# Patient Record
Sex: Female | Born: 1975 | Race: White | Hispanic: No | Marital: Single | Smoking: Light tobacco smoker
Health system: Southern US, Community
[De-identification: ages and names within clinical notes are randomized; demographics above are authoritative.]

---

## 2003-11-07 ENCOUNTER — Other Ambulatory Visit: Admission: RE | Admit: 2003-11-07 | Discharge: 2003-11-07 | Payer: Self-pay | Admitting: Obstetrics & Gynecology

## 2003-12-05 ENCOUNTER — Ambulatory Visit (HOSPITAL_COMMUNITY): Admission: RE | Admit: 2003-12-05 | Discharge: 2003-12-05 | Payer: Self-pay | Admitting: Obstetrics and Gynecology

## 2004-02-16 ENCOUNTER — Inpatient Hospital Stay (HOSPITAL_COMMUNITY): Admission: AD | Admit: 2004-02-16 | Discharge: 2004-02-16 | Payer: Self-pay | Admitting: Obstetrics and Gynecology

## 2004-03-02 ENCOUNTER — Encounter: Admission: RE | Admit: 2004-03-02 | Discharge: 2004-03-02 | Payer: Self-pay | Admitting: Obstetrics & Gynecology

## 2004-05-17 ENCOUNTER — Inpatient Hospital Stay (HOSPITAL_COMMUNITY): Admission: AD | Admit: 2004-05-17 | Discharge: 2004-05-22 | Payer: Self-pay | Admitting: Obstetrics and Gynecology

## 2004-05-19 ENCOUNTER — Encounter (INDEPENDENT_AMBULATORY_CARE_PROVIDER_SITE_OTHER): Payer: Self-pay | Admitting: *Deleted

## 2004-05-23 ENCOUNTER — Encounter: Admission: RE | Admit: 2004-05-23 | Discharge: 2004-06-22 | Payer: Self-pay | Admitting: Obstetrics and Gynecology

## 2005-01-06 ENCOUNTER — Other Ambulatory Visit: Admission: RE | Admit: 2005-01-06 | Discharge: 2005-01-06 | Payer: Self-pay | Admitting: Obstetrics & Gynecology

## 2005-06-18 ENCOUNTER — Inpatient Hospital Stay (HOSPITAL_COMMUNITY): Admission: RE | Admit: 2005-06-18 | Discharge: 2005-06-20 | Payer: Self-pay | Admitting: Obstetrics & Gynecology

## 2007-01-04 ENCOUNTER — Encounter: Admission: RE | Admit: 2007-01-04 | Discharge: 2007-01-04 | Payer: Self-pay | Admitting: Family Medicine

## 2009-10-14 ENCOUNTER — Inpatient Hospital Stay (HOSPITAL_COMMUNITY): Admission: AD | Admit: 2009-10-14 | Discharge: 2009-10-14 | Payer: Self-pay | Admitting: Obstetrics & Gynecology

## 2011-03-24 LAB — COMPREHENSIVE METABOLIC PANEL
ALT: 21 U/L (ref 0–35)
AST: 23 U/L (ref 0–37)
Albumin: 4 g/dL (ref 3.5–5.2)
Alkaline Phosphatase: 66 U/L (ref 39–117)
BUN: 10 mg/dL (ref 6–23)
CO2: 24 mEq/L (ref 19–32)
Calcium: 9.2 mg/dL (ref 8.4–10.5)
Chloride: 106 mEq/L (ref 96–112)
Creatinine, Ser: 0.7 mg/dL (ref 0.4–1.2)
GFR calc Af Amer: >60 mL/min (ref >60)
GFR calc non Af Amer: >60 mL/min (ref >60)
Glucose, Bld: 93 mg/dL (ref 70–99)
Potassium: 4.2 mEq/L (ref 3.5–5.1)
Sodium: 136 mEq/L (ref 135–145)
Total Bilirubin: 0.6 mg/dL (ref 0.3–1.2)
Total Protein: 6.9 g/dL (ref 6.0–8.3)

## 2011-03-24 LAB — URINALYSIS, ROUTINE W REFLEX MICROSCOPIC
Bilirubin Urine: NEGATIVE
Glucose, UA: NEGATIVE mg/dL
Ketones, ur: NEGATIVE mg/dL
Leukocytes, UA: NEGATIVE
Nitrite: NEGATIVE
Protein, ur: NEGATIVE mg/dL
Specific Gravity, Urine: 1.015 (ref 1.005–1.030)
Urobilinogen, UA: 0.2 mg/dL (ref 0.0–1.0)
pH: 5.5 (ref 5.0–8.0)

## 2011-03-24 LAB — DIFFERENTIAL
Basophils Absolute: 0 10*3/uL (ref 0.0–0.1)
Basophils Relative: 1 % (ref 0–1)
Eosinophils Absolute: 0.2 10*3/uL (ref 0.0–0.7)
Eosinophils Relative: 2 % (ref 0–5)
Lymphocytes Relative: 33 % (ref 12–46)
Lymphs Abs: 2.8 10*3/uL (ref 0.7–4.0)
Monocytes Absolute: 0.7 10*3/uL (ref 0.1–1.0)
Monocytes Relative: 9 % (ref 3–12)
Neutro Abs: 4.6 10*3/uL (ref 1.7–7.7)
Neutrophils Relative %: 55 % (ref 43–77)

## 2011-03-24 LAB — CBC
HCT: 41 % (ref 36.0–46.0)
Hemoglobin: 14.2 g/dL (ref 12.0–15.0)
MCHC: 34.5 g/dL (ref 30.0–36.0)
MCV: 93.6 fL (ref 78.0–100.0)
Platelets: 196 10*3/uL (ref 150–400)
RBC: 4.39 MIL/uL (ref 3.87–5.11)
RDW: 12.2 % (ref 11.5–15.5)
WBC: 8.3 10*3/uL (ref 4.0–10.5)

## 2011-03-24 LAB — URINE MICROSCOPIC-ADD ON

## 2011-03-24 LAB — POCT PREGNANCY, URINE: Preg Test, Ur: NEGATIVE

## 2011-05-06 NOTE — H&P (Signed)
NAMEJENA, Rebecca Mays            ACCOUNT NO.:  [id number]   MEDICAL RECORD NO.:  [id number]          PATIENT TYPE:  INP   LOCATION:  NA                            FACILITY:  [facility]   PHYSICIAN:  Freddy Finner, M.D.   DATE OF BIRTH:  1976/09/28   DATE OF ADMISSION:  DATE OF DISCHARGE:                                HISTORY & PHYSICAL   DATE OF ADMISSION:  June 18, 2005   ADMITTING DIAGNOSES:  1.  Intrauterine pregnancy at term.  2.  Surgically scarred uterus.   The patient is a 35 year old white married female gravida 3 para 1 by  cesarean section who is now admitted at 46 and two-sevenths weeks gestation  for repeat cesarean delivery. Her prenatal course has been uncomplicated.   REVIEW OF SYSTEMS:  Negative.   Past medical history, family history are recorded in the prenatal summary  and will not be repeated.   PHYSICAL EXAMINATION:  HEENT:  Grossly normal. Thyroid gland is not palpably  enlarged.  VITAL SIGNS:  Blood pressure is 110/64.  CHEST:  Clear to auscultation.  HEART:  Normal sinus rhythm without murmurs, rubs, or gallops.  ABDOMEN:  Gravid. Estimated fetal size of 8-and-a-half pounds or more.  PELVIC:  Exam is not performed.  EXTREMITIES:  Show +1 edema without cyanosis or clubbing.   ASSESSMENT:  Intrauterine pregnancy at term.   PLAN:  Repeat cesarean delivery.       WRN/MEDQ  D:  06/17/2005  T:  06/17/2005  Job:  [id number]

## 2011-05-06 NOTE — Op Note (Signed)
NAMEJANAI, Rebecca Mays                      ACCOUNT NO.:  [id number]   MEDICAL RECORD NO.:  [id number]                   PATIENT TYPE:  INP   LOCATION:  9135                                 FACILITY:  [facility]   PHYSICIAN:  Conni Elliot, M.D.             DATE OF BIRTH:  [birth year]   DATE OF PROCEDURE:  05/19/2004  DATE OF DISCHARGE:                                 OPERATIVE REPORT   PREOPERATIVE DIAGNOSES:  1. Arrest of descent.  2. Fetal tachycardia.  3. Gestational diabetes.   POSTOPERATIVE DIAGNOSES:  1. Arrest of descent.  2. Fetal tachycardia.  3. Gestational diabetes.   OPERATION:  Low transverse cesarean delivery.   OPERATOR:  Conni Elliot, M.D.   ANESTHESIA:  Continuous lumbar epidural.   OPERATIVE FINDINGS:  A 9 pound 5 ounce female with Apgars of 5, 7, and 8 at  one, five, and 10 minutes, respectively.  Cord pH was 7.24.  The placenta  was sent to pathology.   PROCEDURE:  After placing the patient under a continuous lumbar epidural  anesthetic, the patient supine in the left lateral tilt position receiving  oxygen, the abdomen was prepped and draped in a sterile fashion.  A low  transverse Pfannenstiel incision was made.  Care was taken to minimize the  impact on the patient's tattoo on her left lower abdomen.  An incision was  made in the skin and subcutaneous, fascia, rectus muscles separated, the  peritoneum entered, and a bladder flap created.  A low transverse uterine  incision was made.  The baby was delivered from a vertex presentation, the  cord doubly clamped and cut, and the baby handed to neonatology in  attendance.  The placenta delivered spontaneously.  The uterus was soft and  boggy; therefore, the patient received 250 mcg of Hemabate.  The uterus,  bladder flap, anterior peritoneum, fascia, subcutaneous tissue, and skin  were closed in the usual fashion using copious irrigation fluid.  The  instrument count was correct.                                 Conni Elliot, M.D.    ASG/MEDQ  D:  05/19/2004  T:  05/19/2004  Job:  [id number]

## 2011-05-06 NOTE — Discharge Summary (Signed)
NAMELady Saucier Mays            ACCOUNT NO.:  [id number]   MEDICAL RECORD NO.:  [id number]          PATIENT TYPE:  INP   LOCATION:  9133                          FACILITY:  [facility]   PHYSICIAN:  Guy Sandifer. Henderson Cloud, M.D. DATE OF BIRTH:  09/13/1976   DATE OF ADMISSION:  06/18/2005  DATE OF DISCHARGE:  06/20/2005                                 DISCHARGE SUMMARY   ADMITTING DIAGNOSES:  1.  Intrauterine pregnancy at term.  2.  Surgically scarred uterus.   DISCHARGE DIAGNOSES:  1.  Status post low transverse cesarean section.  2.  Viable female infant.   PROCEDURE:  Repeat low transverse cesarean section.   REASON FOR ADMISSION:  Please see dictated H&P.   HOSPITAL COURSE:  Patient is a 35 year old white married female gravida 3,  para 1 that was admitted to [facility] at 39-2/7 weeks  estimated gestational age for a scheduled cesarean section.  The patient had  had a previous cesarean delivery and desired repeat.  On the morning of  admission patient was taken to the operating room where spinal anesthesia  was administered without difficulty.  A low transverse incision was made  with delivery of a viable female infant weighing 9 pounds 5 ounces.  Apgars  of 9 at one minute, 9 at five minutes.  Cord pH was 7.3.  Patient tolerated  procedure well and was taken to the recovery room in stable condition.  On  postoperative day one patient without complaint.  Vital signs are stable.  She was afebrile.  Abdomen soft with good return of bowel function.  Fundus  is firm and nontender.  Abdominal dressing was said to be clean, dry, and  intact.  Patient is ambulating well.  Laboratories findings reveal a  hemoglobin of 11.3.  On postoperative day two patient desired Mollie  discharge.  Vital signs are stable.  She was afebrile.  Abdomen soft.  Fundus was firm and nontender.  Incision was clean, dry, and intact.  She  was ambulating well and tolerating a regular diet without  complaints of  nausea and vomiting.  Discharge instructions reviewed and patient was  discharged home.   CONDITION ON DISCHARGE:  Good.   DIET:  Regular, as tolerated.   ACTIVITY:  No heavy lifting.  No driving x2 weeks.   FOLLOW-UP:  Patient to follow up in the office in two days for staple  removal.  She is to call for temperature greater than 100 degrees,  persistent nausea or vomiting, heavy vaginal bleeding, and/or redness or  drainage from her incisional site.   DISCHARGE MEDICATIONS:  1.  Darvocet-N 100 #30 one p.o. every six hours p.r.n.  2.  Motrin 600 mg every six hours.  3.  Prenatal vitamins one p.o. daily.  4.  Colace one p.o. daily p.r.n.      Julio Sicks, N.P.      Guy Sandifer. Henderson Cloud, M.D.  Electronically Signed    CC/MEDQ  D:  07/15/2005  T:  07/15/2005  Job:  [id number]

## 2011-05-06 NOTE — Discharge Summary (Signed)
Rebecca Mays, Rebecca Mays                      ACCOUNT NO.:  [id number]   MEDICAL RECORD NO.:  [id number]                   PATIENT TYPE:  INP   LOCATION:  9135                                 [facility]ACILITY:  [facility]   PHYSICIAN:  Conni Elliot, M.D.             DATE OF BIRTH:  Nov 17, 1976   DATE OF ADMISSION:  05/17/2004  DATE OF DISCHARGE:  05/22/2004                                 DISCHARGE SUMMARY   HISTORY OF PRESENT ILLNESS:  A 35 year old gravida 2, para 0-0-1-0 who was  admitted on May 17, 2004 with spontaneous rupture of membranes at 40-5/7  weeks. She was followed by physician's for [facility] the [facility]. She was diagnosed with diet controlled gestational diabetes. Blood  type O negative and Rubella immune.   HOSPITAL COURSE:  The patient was admitted and did receive epidural  anesthesia. Her labor progressed to complete. However, she had arrest of  descent and fetal tachycardia and on May 20, 2003, underwent a primary low  transverse cesarean section and was delivered of a female. Weight was 9  pounds 5 ounces. Estimated blood loss was between 800 and 1,000 cc. Surgery  was non-complicated.   SURGEON:  Corky Sox, M.D.   ADMISSION DIAGNOSES:  1. Term pregnancy.  2. Gestational diabetes.   DISCHARGE DIAGNOSES:  1. Term pregnancy, delivered.  2. Primary low transverse cesarean section.  3. Gestational diabetes.  4. Failure to descend.  5. Fetal tachycardia.   DISCHARGE MEDICATIONS:  1. Motrin and Tylenol p.r.n. over-the-counter.  2. Ferrous sulfate 325 mg p.o. t.i.d. with meals.  3. Colace 100 mg b.i.d.  4. Darvocet 100 #30 tabs 1 to 2 p.o. q. 4 hours p.r.n. pain, not to exceed 8     in 24 hours with 1 refill.   ACTIVITY:  No driving or heavy lifting for 2 weeks. Nothing in the vagina  for 6 weeks. Take all medications as prescribed.   FOLLOW UP:  With for 6 week checkup appointment.   DISPOSITION:  The patient is discharged on  postoperative day 3. She is  eating, drinking and ambulating without difficulty. Her incision is intact  with staples, which will be removed and steri-strips applied. Her hemoglobin  is 8.5. However, she is asymptomatic.   CONDITION ON DISCHARGE:  Stable.     Jeb Levering, M.D.                     Conni Elliot, M.D.    [place]/MEDQ  D:  05/22/2004  T:  05/24/2004  Job:  [id number]

## 2011-05-06 NOTE — Op Note (Signed)
Rebecca Mays, Rebecca Mays                      ACCOUNT NO.:  [id number]   MEDICAL RECORD NO.:  [id number]                   PATIENT TYPE:  INP   LOCATION:  9135                                 [facility]ILITY[facility]   PHYSICIAN:  Conni Elliot, M.D.             DATE OF BIRTH:  [birth year]   DATE OF PROCEDURE:  DATE OF DISCHARGE:                                 OPERATIVE REPORT   No dictation for this job.                                               Conni Elliot, M.D.    ASG/MEDQ  D:  05/19/2004  T:  05/19/2004  Job:  [id number]

## 2011-05-06 NOTE — Op Note (Signed)
Rebecca Mays, Rebecca Mays            ACCOUNT NO.:  [id number]   MEDICAL RECORD NO.:  [id number]          PATIENT TYPE:  INP   LOCATION:  9133                          FACILITY:  [facility]   PHYSICIAN:  Freddy Finner, M.D.   DATE OF BIRTH:  12-11-1976   DATE OF PROCEDURE:  06/18/2005  DATE OF DISCHARGE:                                 OPERATIVE REPORT   PREOPERATIVE DIAGNOSES:  1.  Intrauterine pregnancy at term.  2.  Surgically scarred uterus.   POSTOPERATIVE DIAGNOSES:  1.  Intrauterine pregnancy at term.  2.  Surgically scarred uterus.   OPERATIVE PROCEDURE:  1.  Repeat low transverse cervical cesarean section.  2.  Delivery of viable female infant, Apgars of 9 and 9, arterial cord pH of      7.30.   ANESTHESIA:  Spinal.   INTRAOPERATIVE COMPLICATIONS:  None.   ESTIMATED INTRAOPERATIVE BLOOD LOSS:  600-800 mL.   The patient was admitted on the morning of surgery.  She was brought to the  operating room, placed under adequate spinal anesthesia, placed in the  dorsal recumbent position with elevation of the right hip of 15 degrees.  Abdomen was prepped and draped in the usual fashion.  Foley catheter was  placed using sterile technique.  A lower abdominal transverse incision was  made through an old scar and carried sharply down to fascia.  Fascia was  entered sharply and extended to the extent of skin incision.  Rectus sheath  was developed superiorly and inferiorly with blunt and sharp dissection.  Rectus muscles were divided in the midline.  Peritoneum was entered  elevated, entered sharply, and extended bluntly to the extent of skin  incision.  Bladder blade was placed.  Transverse incision was made in the  visceral peritoneum overlying the lower uterine segment and the bladder  bluntly dissected off the lower segment.  Transverse incision was made in  the lower uterine segment.  Amnion was entered.  Fluid was clear.  Incision  was extended bluntly in a transverse direction.   Using the Kiwi vacuum  extractor, a  viable female infant was then delivered without significant  difficulty.  Arterial cord blood and venous cord blood were both obtained.  Placenta and other products of conception were removed from the uterus.  This was confirmed by manual exploration of the uterine cavity.  Uterus was  delivered onto the anterior abdominal wall.  Tubes and ovaries were normal.  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  These were left in place.  Uterine  incision was closed with running locking 0 Monocryl for the first layer, an  imbricating suture of 0 Monocryl for the second layer.  Irrigation was  carried out.  The uterus was placed back into the abdominal cavity.  Abdominal incision was closed in layers.  Running 0  Monocryl was used to close the peritoneum and reapproximate the rectus  muscles.  Fascia was closed with running 0 PDS.  Skin was closed with wide  skin staples and quarter-inch Steri-Strips.  The patient tolerated the  operative procedure well and was taken to recovery in good condition.  WRN/MEDQ  D:  06/18/2005  T:  06/18/2005  Job:  [id number]

## 2011-12-30 ENCOUNTER — Ambulatory Visit: Payer: Self-pay

## 2011-12-30 DIAGNOSIS — R209 Unspecified disturbances of skin sensation: Secondary | ICD-10-CM

## 2012-05-20 ENCOUNTER — Ambulatory Visit: Payer: Self-pay

## 2012-05-20 DIAGNOSIS — Z23 Encounter for immunization: Secondary | ICD-10-CM

## 2012-12-20 ENCOUNTER — Ambulatory Visit: Payer: Medicaid Other | Attending: Orthopedic Surgery | Admitting: Physical Therapy

## 2012-12-20 DIAGNOSIS — M25579 Pain in unspecified ankle and joints of unspecified foot: Secondary | ICD-10-CM | POA: Insufficient documentation

## 2012-12-20 DIAGNOSIS — IMO0001 Reserved for inherently not codable concepts without codable children: Secondary | ICD-10-CM | POA: Insufficient documentation

## 2012-12-24 ENCOUNTER — Ambulatory Visit: Payer: Medicaid Other | Admitting: Physical Therapy

## 2013-01-02 ENCOUNTER — Ambulatory Visit: Payer: Medicaid Other | Admitting: Physical Therapy

## 2016-08-08 ENCOUNTER — Other Ambulatory Visit: Payer: Self-pay | Admitting: Physician Assistant

## 2016-08-08 DIAGNOSIS — N644 Mastodynia: Secondary | ICD-10-CM

## 2016-08-15 ENCOUNTER — Other Ambulatory Visit: Payer: Self-pay

## 2016-09-05 ENCOUNTER — Ambulatory Visit
Admission: RE | Admit: 2016-09-05 | Discharge: 2016-09-05 | Disposition: A | Discharge status: Home / Self Care | Payer: BLUE CROSS/BLUE SHIELD | Source: Ambulatory Visit | Attending: Physician Assistant | Admitting: Physician Assistant

## 2016-09-05 ENCOUNTER — Other Ambulatory Visit: Payer: Self-pay | Admitting: Physician Assistant

## 2016-09-05 DIAGNOSIS — N644 Mastodynia: Secondary | ICD-10-CM

## 2016-09-05 DIAGNOSIS — R52 Pain, unspecified: Secondary | ICD-10-CM

## 2018-03-08 ENCOUNTER — Ambulatory Visit (INDEPENDENT_AMBULATORY_CARE_PROVIDER_SITE_OTHER): Payer: Self-pay | Admitting: Physician Assistant

## 2018-03-08 ENCOUNTER — Ambulatory Visit (INDEPENDENT_AMBULATORY_CARE_PROVIDER_SITE_OTHER): Payer: Self-pay

## 2018-03-08 ENCOUNTER — Encounter: Payer: Self-pay | Admitting: Physician Assistant

## 2018-03-08 ENCOUNTER — Other Ambulatory Visit: Payer: Self-pay

## 2018-03-08 VITALS — BP 118/74 | HR 81 | Temp 98.8°F | Resp 18 | Ht 66.85 in | Wt 166.4 lb

## 2018-03-08 DIAGNOSIS — N644 Mastodynia: Secondary | ICD-10-CM

## 2018-03-08 DIAGNOSIS — M542 Cervicalgia: Secondary | ICD-10-CM

## 2018-03-08 DIAGNOSIS — M62838 Other muscle spasm: Secondary | ICD-10-CM

## 2018-03-08 DIAGNOSIS — R0781 Pleurodynia: Secondary | ICD-10-CM

## 2018-03-08 DIAGNOSIS — R58 Hemorrhage, not elsewhere classified: Secondary | ICD-10-CM

## 2018-03-08 MED ORDER — CYCLOBENZAPRINE HCL 10 MG PO TABS: 10.0000 mg | ORAL_TABLET | Freq: Three times a day (TID) | ORAL | 0 refills | Status: DC | PRN

## 2018-03-08 MED ORDER — NAPROXEN 500 MG PO TABS: 500.0000 mg | ORAL_TABLET | Freq: Two times a day (BID) | ORAL | 0 refills | Status: DC

## 2018-03-08 NOTE — Patient Instructions (Addendum)
Your x-rays were normal.  Your pain you are experiencing is likely all muscular pain.  You may be even sore tomorrow.  I recommend applying ice to your bruise and heat to your neck.  You can apply this for 20 minutes at a time 4-5 times a day.  I have also given you prescription for anti-inflammatory to use for pain.  I have given you a muscle relaxer to use as well.  Keep  in mind that muscle relaxers can cause you to be drowsy.   He should start trying neck exercises and range of motion exercises with her shoulders as soon as you can tolerate.  Please follow-up if any of your symptoms persist after 1 week or anything worsens or you develop new concerning symptoms. Muscle Cramps and Spasms Muscle cramps and spasms are when muscles tighten by themselves. They usually get better within minutes. Muscle cramps are painful. They are usually stronger and last longer than muscle spasms. Muscle spasms may or may not be painful. They can last a few seconds or much longer. Follow these instructions at home:  Drink enough fluid to keep your pee (urine) clear or pale yellow.  Massage, stretch, and relax the muscle.  If directed, apply heat to tight or tense muscles as often as told by your doctor. Use the heat source that your doctor recommends. ? Place a towel between your skin and the heat source. ? Leave the heat on for 20-30 minutes. ? Take off the heat if your skin turns bright red. This is especially important if you are unable to feel pain, heat, or cold. You may have a greater risk of getting burned.  If directed, put ice on the affected area. This may help if you are sore or have pain after a cramp or spasm. ? Put ice in a plastic bag. ? Place a towel between your skin and the bag. ? Leave the ice on for 20 minutes, 2-3 times a day.  Take over-the-counter and prescription medicines only as told by your doctor.  Pay attention to any changes in your symptoms. Contact a doctor if:  Your cramps  or spasms get worse or happen more often.  Your cramps or spasms do not get better with time. This information is not intended to replace advice given to you by your health care provider. Make sure you discuss any questions you have with your health care provider. Document Released: 11/17/2008 Document Revised: 01/06/2016 Document Reviewed: 09/08/2015 Elsevier Interactive Patient Education  2018 [employer].  Contusion A contusion is a deep bruise. Contusions happen when an injury causes bleeding under the skin. Symptoms of bruising include pain, swelling, and discolored skin. The skin may turn blue, purple, or yellow. Follow these instructions at home:  Rest the injured area.  If told, put ice on the injured area. ? Put ice in a plastic bag. ? Place a towel between your skin and the bag. ? Leave the ice on for 20 minutes, 2-3 times per day.  If told, put light pressure (compression) on the injured area using an elastic bandage. Make sure the bandage is not too tight. Remove it and put it back on as told by your doctor.  If possible, raise (elevate) the injured area above the level of your heart while you are sitting or lying down.  Take over-the-counter and prescription medicines only as told by your doctor. Contact a doctor if:  Your symptoms do not get better after several days of treatment.  Your symptoms get worse.  You have trouble moving the injured area. Get help right away if:  You have very bad pain.  You have a loss of feeling (numbness) in a hand or foot.  Your hand or foot turns pale or cold. This information is not intended to replace advice given to you by your health care provider. Make sure you discuss any questions you have with your health care provider. Document Released: 05/23/2008 Document Revised: 05/12/2016 Document Reviewed: 04/22/2015 Elsevier Interactive Patient Education  2018 [place] for Routine Care of Injuries Many injuries can  be cared for using rest, ice, compression, and elevation (RICE therapy). Using RICE therapy can help to lessen pain and swelling. It can help your body to heal. Rest Reduce your normal activities and avoid using the injured part of your body. You can go back to your normal activities when you feel okay and your doctor says it is okay. Ice Do not put ice on your bare skin.  Put ice in a plastic bag.  Place a towel between your skin and the bag.  Leave the ice on for 20 minutes, 2-3 times a day.  Do this for as long as told by your doctor. Compression Compression means putting pressure on the injured area. This can be done with an elastic bandage. If an elastic bandage has been applied:  Remove and reapply the bandage every 3-4 hours or as told by your doctor.  Make sure the bandage is not wrapped too tight. Wrap the bandage more loosely if part of your body beyond the bandage is blue, swollen, cold, painful, or loses feeling (numb).  See your doctor if the bandage seems to make your problems worse.  Elevation Elevation means keeping the injured area raised. Raise the injured area above your heart or the center of your chest if you can. When should I get help? You should get help if:  You keep having pain and swelling.  Your symptoms get worse.  Get help right away if: You should get help right away if:  You have sudden bad pain at or below the area of your injury.  You have redness or more swelling around your injury.  You have tingling or numbness at or below the injury that does not go away when you take off the bandage.  This information is not intended to replace advice given to you by your health care provider. Make sure you discuss any questions you have with your health care provider. Document Released: 05/23/2008 Document Revised: 11/01/2016 Document Reviewed: 11/12/2014 Elsevier Interactive Patient Education  2017 [employer]  Cervical Strain and Sprain  Rehab Ask your health care provider which exercises are safe for you. Do exercises exactly as told by your health care provider and adjust them as directed. It is normal to feel mild stretching, pulling, tightness, or discomfort as you do these exercises, but you should stop right away if you feel sudden pain or your pain gets worse.Do not begin these exercises until told by your health care provider. Stretching and range of motion exercises These exercises warm up your muscles and joints and improve the movement and flexibility of your neck. These exercises also help to relieve pain, numbness, and tingling. Exercise A: Cervical side bend  1. Using good posture, sit on a stable chair or stand up. 2. Without moving your shoulders, slowly tilt your left / right ear to your shoulder until you feel a stretch in your neck  muscles. You should be looking straight ahead. 3. Hold for __________ seconds. 4. Repeat with the other side of your neck. Repeat __________ times. Complete this exercise __________ times a day. Exercise B: Cervical rotation  1. Using good posture, sit on a stable chair or stand up. 2. Slowly turn your head to the side as if you are looking over your left / right shoulder. ? Keep your eyes level with the ground. ? Stop when you feel a stretch along the side and the back of your neck. 3. Hold for __________ seconds. 4. Repeat this by turning to your other side. Repeat __________ times. Complete this exercise __________ times a day. Exercise C: Thoracic extension and pectoral stretch 1. Roll a towel or a small blanket so it is about 4 inches (10 cm) in diameter. 2. Lie down on your back on a firm surface. 3. Put the towel lengthwise, under your spine in the middle of your back. It should not be not under your shoulder blades. The towel should line up with your spine from your middle back to your lower back. 4. Put your hands behind your head and let your elbows fall out to your  sides. 5. Hold for __________ seconds. Repeat __________ times. Complete this exercise __________ times a day. Strengthening exercises These exercises build strength and endurance in your neck. Endurance is the ability to use your muscles for a long time, even after your muscles get tired. Exercise D: Upper cervical flexion, isometric 1. Lie on your back with a thin pillow behind your head and a small rolled-up towel under your neck. 2. Gently tuck your chin toward your chest and nod your head down to look toward your feet. Do not lift your head off the pillow. 3. Hold for __________ seconds. 4. Release the tension slowly. Relax your neck muscles completely before you repeat this exercise. Repeat __________ times. Complete this exercise __________ times a day. Exercise E: Cervical extension, isometric  1. Stand about 6 inches (15 cm) away from a wall, with your back facing the wall. 2. Place a soft object, about 6-8 inches (15-20 cm) in diameter, between the back of your head and the wall. A soft object could be a small pillow, a ball, or a folded towel. 3. Gently tilt your head back and press into the soft object. Keep your jaw and forehead relaxed. 4. Hold for __________ seconds. 5. Release the tension slowly. Relax your neck muscles completely before you repeat this exercise. Repeat __________ times. Complete this exercise __________ times a day. Posture and body mechanics  Body mechanics refers to the movements and positions of your body while you do your daily activities. Posture is part of body mechanics. Good posture and healthy body mechanics can help to relieve stress in your body's tissues and joints. Good posture means that your spine is in its natural S-curve position (your spine is neutral), your shoulders are pulled back slightly, and your head is not tipped forward. The following are general guidelines for applying improved posture and body mechanics to your everyday  activities. Standing  When standing, keep your spine neutral and keep your feet about hip-width apart. Keep a slight bend in your knees. Your ears, shoulders, and hips should line up.  When you do a task in which you stand in one place for a long time, place one foot up on a stable object that is 2-4 inches (5-10 cm) high, such as a footstool. This helps keep your spine  neutral. Sitting   When sitting, keep your spine neutral and your keep feet flat on the floor. Use a footrest, if necessary, and keep your thighs parallel to the floor. Avoid rounding your shoulders, and avoid tilting your head forward.  When working at a desk or a computer, keep your desk at a height where your hands are slightly lower than your elbows. Slide your chair under your desk so you are close enough to maintain good posture.  When working at a computer, place your monitor at a height where you are looking straight ahead and you do not have to tilt your head forward or downward to look at the screen. Resting When lying down and resting, avoid positions that are most painful for you. Try to support your neck in a neutral position. You can use a contour pillow or a small rolled-up towel. Your pillow should support your neck but not push on it. This information is not intended to replace advice given to you by your health care provider. Make sure you discuss any questions you have with your health care provider. Document Released: 12/05/2005 Document Revised: 08/11/2016 Document Reviewed: 11/11/2015 Elsevier Interactive Patient Education  2018 [employer].  IF you received an x-ray today, you will receive an invoice from [facility]. Please contact [facility] at [phone] with questions or concerns regarding your invoice.   IF you received labwork today, you will receive an invoice from [facility]. Please contact [facility] at [phone] with questions or concerns regarding your invoice.   Our  billing staff will not be able to assist you with questions regarding bills from these companies.  You will be contacted with the lab results as soon as they are available. The fastest way to get your results is to activate your My Chart account. Instructions are located on the last page of this paperwork. If you have not heard from [place] regarding the results in 2 weeks, please contact this office.
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
## 2018-03-26 ENCOUNTER — Ambulatory Visit: Payer: Self-pay | Admitting: Physician Assistant

## 2018-03-29 ENCOUNTER — Telehealth: Payer: Self-pay | Admitting: Family Medicine
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
## 2018-04-02 ENCOUNTER — Telehealth: Payer: Self-pay | Admitting: Physician Assistant

## 2018-04-02 NOTE — Telephone Encounter (Signed)
Called pt to see if I could reschedule the missed appt from 03/26/18 with Timmothy Euler. Left VM for pt to call back and reschedule if needed.

## 2018-04-03 NOTE — Telephone Encounter (Signed)
Phil returned [place] call please call back # [phone]. Phil received message stating the patient name and D.O.B is in the subject box of the forms that were faxed to the office. Will re fax again and please reference subject on the form reflecting patient information, please advise.

## 2018-04-05 ENCOUNTER — Other Ambulatory Visit: Payer: Self-pay

## 2018-04-05 ENCOUNTER — Ambulatory Visit: Payer: Self-pay | Admitting: Physician Assistant

## 2018-04-05 ENCOUNTER — Encounter: Payer: Self-pay | Admitting: Physician Assistant

## 2018-04-05 VITALS — BP 116/80 | HR 79 | Temp 98.4°F | Resp 18 | Ht 65.16 in | Wt 167.4 lb

## 2018-04-05 DIAGNOSIS — N63 Unspecified lump in unspecified breast: Secondary | ICD-10-CM

## 2018-04-05 NOTE — Patient Instructions (Addendum)
I have placed the orders for your imaging. They should contact you within the next week to schedule this. I recommend taking some ibuprofen before the appointment. Continue using warm compress to affected area in the meantime. Thank you for letting me participate in your health and well being.    IF you received an x-ray today, you will receive an invoice from [facility]. Please contact [facility] at [phone] with questions or concerns regarding your invoice.   IF you received labwork today, you will receive an invoice from [place]. Please contact [facility] at [phone] with questions or concerns regarding your invoice.   Our billing staff will not be able to assist you with questions regarding bills from these companies.  You will be contacted with the lab results as soon as they are available. The fastest way to get your results is to activate your My Chart account. Instructions are located on the last page of this paperwork. If you have not heard from [place] regarding the results in 2 weeks, please contact this office.
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
## 2018-05-10 ENCOUNTER — Encounter: Payer: Self-pay | Admitting: Family Medicine

## 2018-11-30 ENCOUNTER — Other Ambulatory Visit (HOSPITAL_COMMUNITY): Payer: Self-pay | Admitting: *Deleted

## 2018-11-30 DIAGNOSIS — N6320 Unspecified lump in the left breast, unspecified quadrant: Secondary | ICD-10-CM

## 2018-12-20 ENCOUNTER — Ambulatory Visit
Admission: RE | Admit: 2018-12-20 | Discharge: 2018-12-20 | Disposition: A | Discharge status: Home / Self Care | Payer: No Typology Code available for payment source | Source: Ambulatory Visit | Attending: Obstetrics and Gynecology | Admitting: Obstetrics and Gynecology

## 2018-12-20 ENCOUNTER — Ambulatory Visit (HOSPITAL_COMMUNITY)
Admission: RE | Admit: 2018-12-20 | Discharge: 2018-12-20 | Disposition: A | Discharge status: Home / Self Care | Payer: Self-pay | Source: Ambulatory Visit | Attending: Obstetrics and Gynecology | Admitting: Obstetrics and Gynecology

## 2018-12-20 ENCOUNTER — Encounter (HOSPITAL_COMMUNITY): Payer: Self-pay

## 2018-12-20 ENCOUNTER — Ambulatory Visit: Payer: Self-pay

## 2018-12-20 VITALS — BP 118/70 | Ht 63.0 in | Wt 174.0 lb

## 2018-12-20 DIAGNOSIS — N6320 Unspecified lump in the left breast, unspecified quadrant: Secondary | ICD-10-CM

## 2018-12-20 DIAGNOSIS — N644 Mastodynia: Secondary | ICD-10-CM

## 2018-12-20 DIAGNOSIS — Z1239 Encounter for other screening for malignant neoplasm of breast: Secondary | ICD-10-CM

## 2018-12-20 DIAGNOSIS — N6325 Unspecified lump in the left breast, overlapping quadrants: Secondary | ICD-10-CM
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
## 2018-12-20 NOTE — Patient Instructions (Signed)
Explained breast self awareness with Rebecca Mays. Patient did not need a Pap smear today due to last Pap smear was in May 2017 per patient. Let her know B[facility]CP will cover Pap smears every 3 years unless has a history of abnormal Pap smears. Patient has an appointment at the free cervical cancer screening at [facility] on Monday, February 11, 2019 at 1800 for a Pap smear. Referred patient to the [place] for a diagnostic mammogram and left breast ultrasound. Appointment scheduled for Thursday, December 20, 2018 at 1450. Patient aware of appointments and will be there. Rebecca Mays verbalized understanding.  Lamont Tant, Arvil Chaco, RN 2:46 PM

## 2018-12-21 ENCOUNTER — Encounter (HOSPITAL_COMMUNITY): Payer: Self-pay | Admitting: *Deleted

## 2019-02-11 ENCOUNTER — Ambulatory Visit: Payer: Self-pay

## 2019-11-04 ENCOUNTER — Other Ambulatory Visit: Payer: Self-pay | Admitting: *Deleted

## 2019-11-04 ENCOUNTER — Encounter (INDEPENDENT_AMBULATORY_CARE_PROVIDER_SITE_OTHER): Payer: Self-pay | Admitting: Physician Assistant

## 2019-11-04 DIAGNOSIS — Z20822 Contact with and (suspected) exposure to covid-19: Secondary | ICD-10-CM

## 2019-11-05 LAB — NOVEL CORONAVIRUS, NAA: SARS-CoV-2, NAA: NOT DETECTED

## 2019-12-26 ENCOUNTER — Ambulatory Visit: Payer: Medicaid Other | Attending: Internal Medicine

## 2019-12-26 DIAGNOSIS — Z20822 Contact with and (suspected) exposure to covid-19: Secondary | ICD-10-CM

## 2019-12-28 LAB — NOVEL CORONAVIRUS, NAA: SARS-CoV-2, NAA: NOT DETECTED

## 2020-01-07 ENCOUNTER — Other Ambulatory Visit: Payer: Medicaid Other

## 2020-01-21 ENCOUNTER — Other Ambulatory Visit (HOSPITAL_COMMUNITY): Payer: Self-pay

## 2020-01-21 DIAGNOSIS — Z1231 Encounter for screening mammogram for malignant neoplasm of breast: Secondary | ICD-10-CM

## 2020-01-29 ENCOUNTER — Encounter: Payer: Self-pay | Admitting: *Deleted

## 2020-02-03 ENCOUNTER — Other Ambulatory Visit: Payer: Self-pay

## 2020-02-03 ENCOUNTER — Ambulatory Visit: Payer: Medicaid Other | Attending: Internal Medicine

## 2020-02-03 DIAGNOSIS — Z23 Encounter for immunization: Secondary | ICD-10-CM | POA: Insufficient documentation

## 2020-02-03 NOTE — Progress Notes (Signed)
   Covid-19 Vaccination Clinic  Name:  Rebecca Mays    MRN: [id number] DOB: 1976/01/24  02/03/2020  [name] was observed post Covid-19 immunization for 15 minutes without incidence. She was provided with Vaccine Information Sheet and instruction to access the V-Safe system.   [name] was instructed to call 911 with any severe reactions post vaccine: Marland Kitchen Difficulty breathing  . Swelling of your face and throat  . A fast heartbeat  . A bad rash all over your body  . Dizziness and weakness    Immunizations Administered    Name Date Dose VIS Date Route   Moderna COVID-19 Vaccine 02/03/2020  3:15 PM 0.5 mL 11/19/2019 Intramuscular   Manufacturer: Moderna   Lot: [id number]   ElmerPO:[id number]

## 2020-02-13 ENCOUNTER — Ambulatory Visit: Payer: Medicaid Other

## 2020-03-03 ENCOUNTER — Ambulatory Visit: Payer: Medicaid Other | Attending: Internal Medicine

## 2020-03-03 DIAGNOSIS — Z23 Encounter for immunization: Secondary | ICD-10-CM

## 2020-03-17 ENCOUNTER — Ambulatory Visit: Payer: Self-pay | Admitting: Advanced Practice Midwife

## 2020-03-17 ENCOUNTER — Ambulatory Visit
Admission: RE | Admit: 2020-03-17 | Discharge: 2020-03-17 | Disposition: A | Discharge status: Home / Self Care | Payer: No Typology Code available for payment source | Source: Ambulatory Visit | Attending: Obstetrics and Gynecology | Admitting: Obstetrics and Gynecology

## 2020-03-17 ENCOUNTER — Encounter: Payer: Self-pay | Admitting: Advanced Practice Midwife

## 2020-03-17 ENCOUNTER — Other Ambulatory Visit: Payer: Self-pay

## 2020-03-17 VITALS — BP 116/72 | Temp 97.1°F | Wt 173.0 lb

## 2020-03-17 DIAGNOSIS — Z1239 Encounter for other screening for malignant neoplasm of breast: Secondary | ICD-10-CM

## 2020-03-17 DIAGNOSIS — Z1231 Encounter for screening mammogram for malignant neoplasm of breast: Secondary | ICD-10-CM

## 2020-03-17 NOTE — Progress Notes (Signed)
Ms. AARIN FRYLING is a 44 y.o. female who presents to [facility] clinic today with no complaints.    Pap Smear: Pap not smear completed today. Last Pap smear was 11/19/2019 at [facility] and was normal. Per patient has no history of an abnormal Pap smear. Last Pap smear result is not available in Epic.   Physical exam: Breasts Breasts symmetrical. No skin abnormalities bilateral breasts. No nipple retraction bilateral breasts. No nipple discharge bilateral breasts. No lymphadenopathy. No lumps palpated bilateral breasts.       Pelvic/Bimanual Pap is not indicated today    Smoking History: Patient has is a current smoker at 1/2 packs per day Patient referred to quit line.    Patient Navigation: Patient education provided. Access to services provided for patient through [facility] program. No interpreter provided. No transportation provided   Colorectal Cancer Screening: Per patient has never had colonoscopy completed No complaints today.    Breast and Cervical Cancer Risk Assessment: Patient does not have family history of breast cancer, known genetic mutations, or radiation treatment to the chest before age 34. Patient does not have history of cervical dysplasia, immunocompromised, or DES exposure in-utero.  Risk Assessment    Risk Scores      03/17/2020 12/20/2018   Last edited by: Demetrius Revel, LPN [place], Heath Gold, RN   5-year risk: 1 % 0.9 %   Lifetime risk: 11.4 % 11.7 %          A: BCCCP exam without pap smear Complaint of none  P: Referred patient to the [place] for a screening mammogram. Appointment scheduled 03/17/2020.  Marcille Buffy DNP, CNM  03/17/20  9:00 AM

## 2020-09-30 ENCOUNTER — Encounter: Payer: Self-pay | Admitting: *Deleted

## 2020-09-30 NOTE — Congregational Nurse Program (Signed)
  Dept: [facility] Nurse Program Note  Date of Encounter: 09/30/2020  Past Medical History: No past medical history on file.  Encounter Details:  CNP Questionnaire - 09/30/20 1809      Questionnaire   Do you give verbal consent to treat you today? Yes    Visit Setting Church or Organization    Location Patient Served At [facility]    Patient Status Not Applicable    Medical Provider Yes    Insurance Private Insurance    Intervention Assess (including screenings);Support          Requested BP check during [place] clinic.  BP 120/76 P67.  Says she is very tired today and very busy all week.  We discussed self care and exchanged ideas about self care.  Client is going out of town this weekend and is looking forward to adult time away.  Encouraged to stop in prn for a check or to talk about any issues.  Karene Fry, RN, MSN, [place] Office [phone] Cell

## 2021-01-08 IMAGING — MG DIGITAL SCREENING BILAT W/ TOMO W/ CAD
8 series · 8 of 24 positions shown · non-contrast
Comparison: Previous exam(s).

CLINICAL DATA: Screening.

EXAM:
DIGITAL SCREENING BILATERAL MAMMOGRAM WITH TOMO AND CAD

[L CC synth-2D]
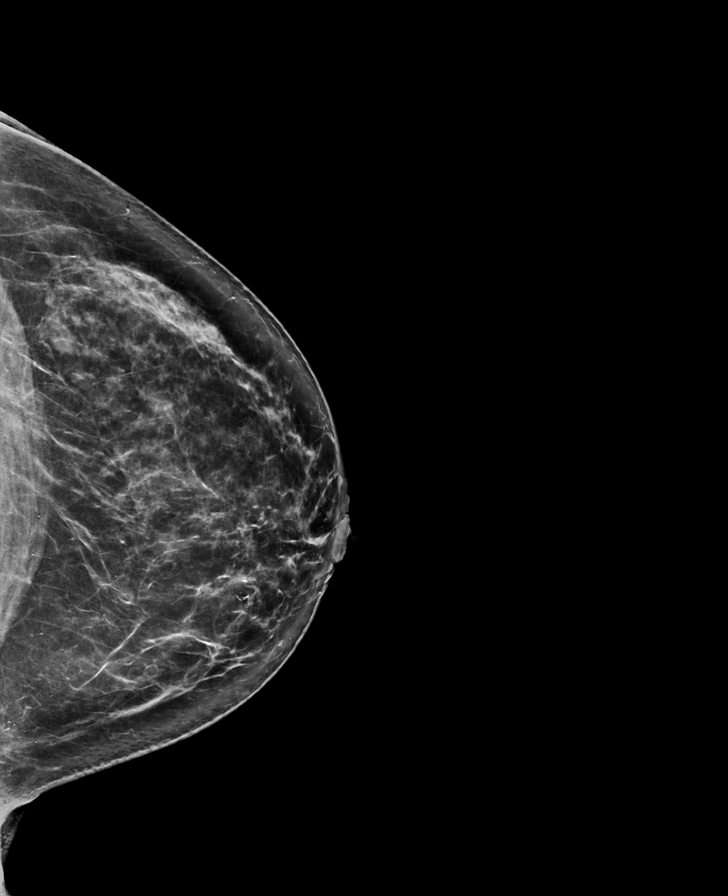

[L MLO synth-2D]
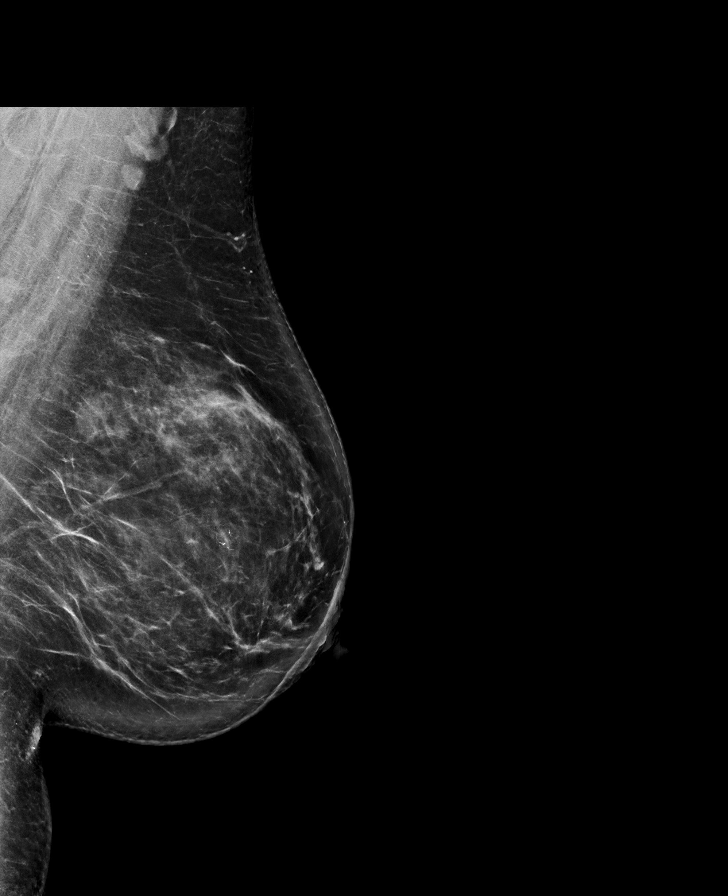

[R CC synth-2D]
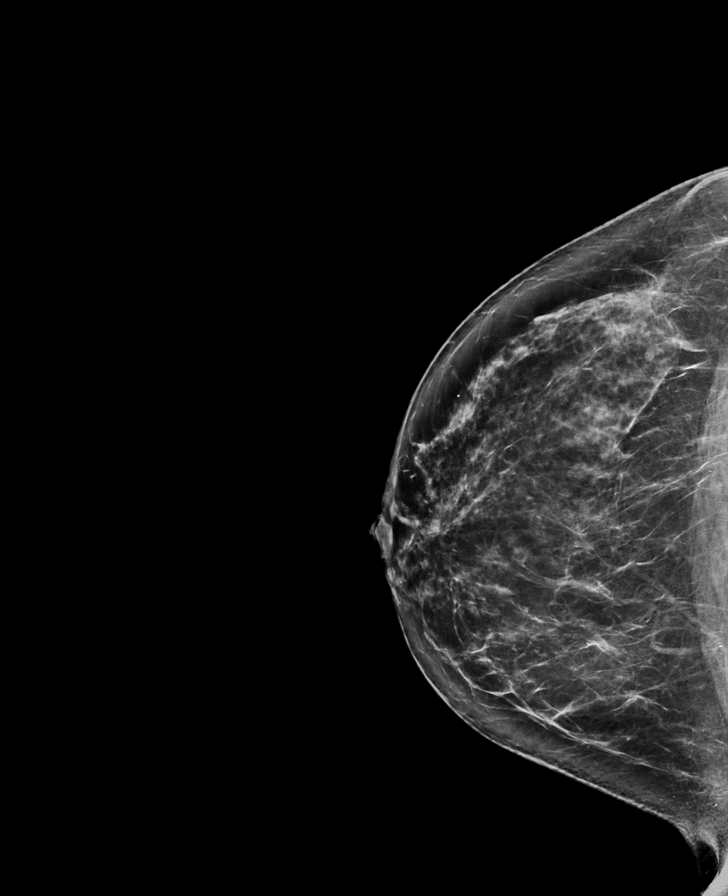

[R MLO synth-2D]
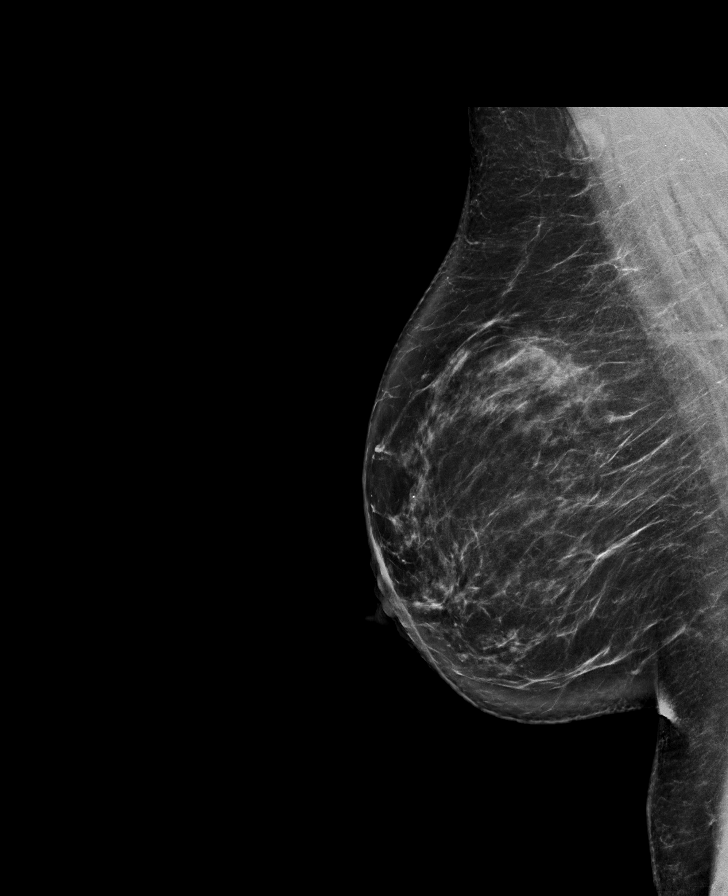

[L MLO tomo · tomo slice 45/90.0]
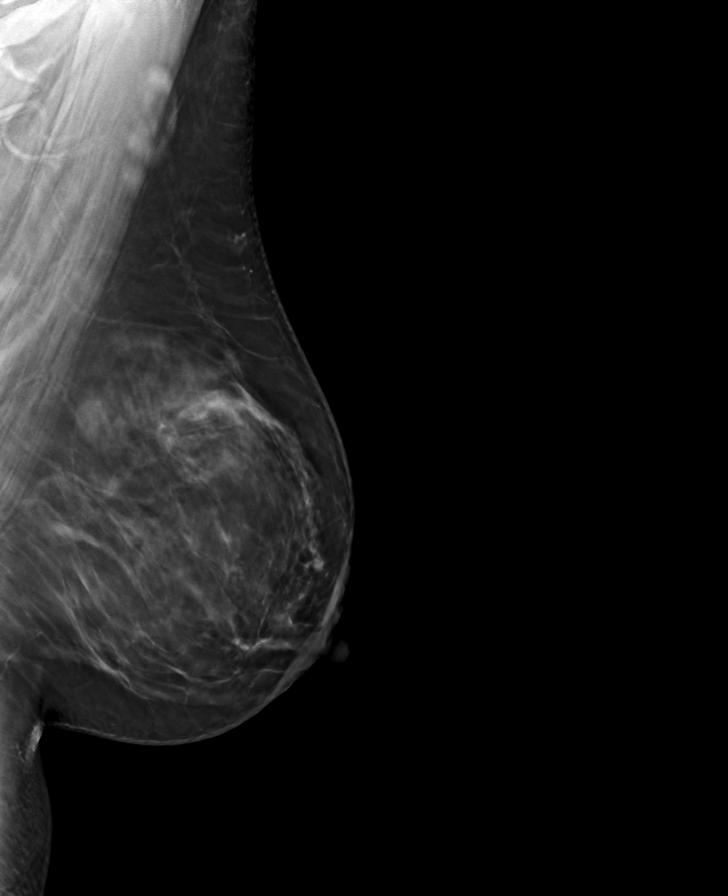

[R MLO tomo · tomo slice 47/92.0]
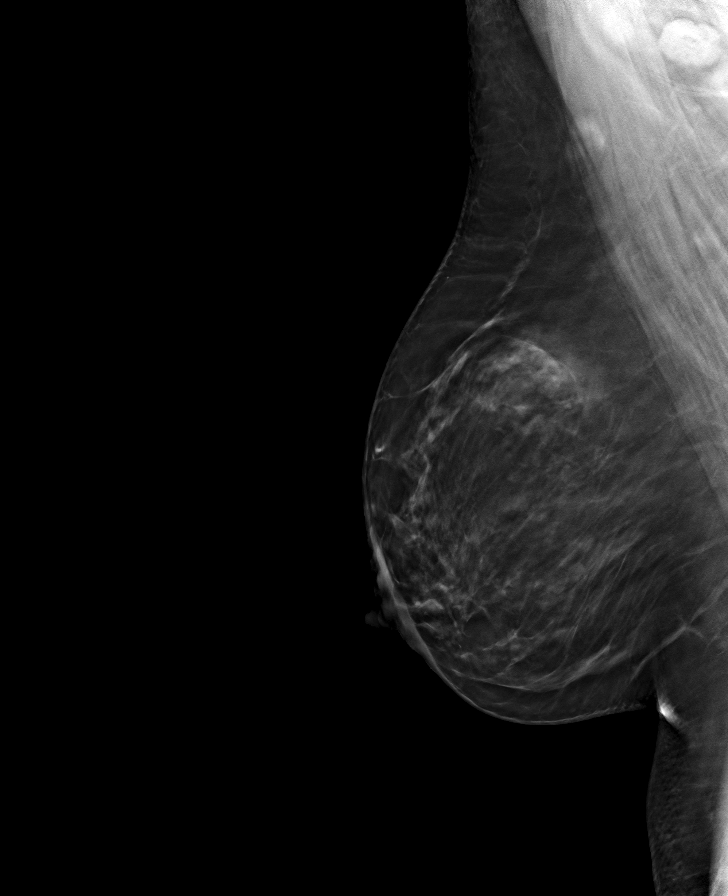

[L CC tomo · tomo slice 40/79.0]
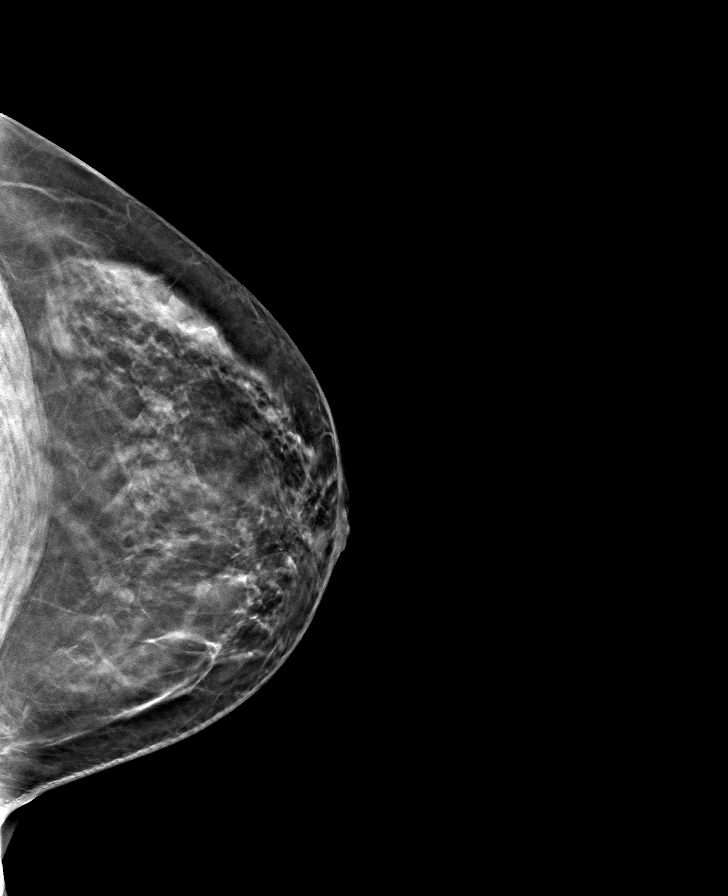

[R CC tomo · tomo slice 39/78.0]
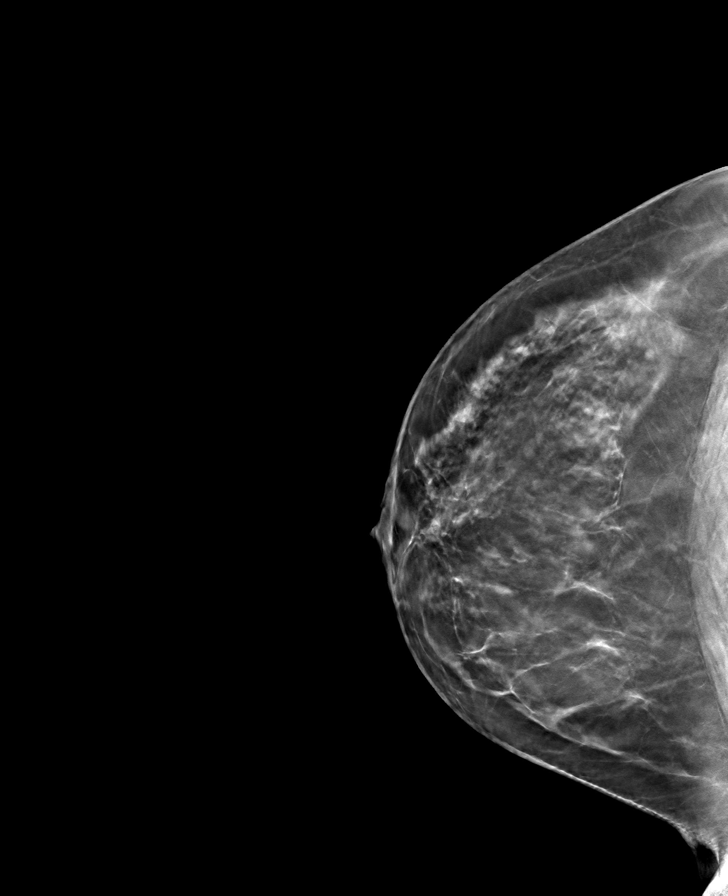

[8 of 24 positions shown; findings below may reference images not displayed]

ACR Breast Density Category b: There are scattered areas of
fibroglandular density.
FINDINGS: There are no findings suspicious for malignancy. Images were
processed with CAD.
IMPRESSION: No mammographic evidence of malignancy. A result letter of this
screening mammogram will be mailed directly to the patient.

RECOMMENDATION:
Screening mammogram in one year. (Code:[id number])

BI-RADS CATEGORY  1: Negative.

## 2022-01-06 ENCOUNTER — Other Ambulatory Visit: Payer: Self-pay

## 2022-01-06 ENCOUNTER — Telehealth: Payer: Self-pay

## 2022-01-06 DIAGNOSIS — N6320 Unspecified lump in the left breast, unspecified quadrant: Secondary | ICD-10-CM

## 2022-01-06 NOTE — Telephone Encounter (Signed)
Kathy at [facility] mobile unit called and stated that patient has shown up to the mobile unit in tears, complains of left breast lump and pain. Patient stated that she was told by a [facility] employee (not [facility]) to just go to mobile unit to get mammogram. Patient complains of left breast lump, hard, immobile, pain to touch, rates pain at 4/10, denies breast discharge and nipple inversion. Patient states she has swelling in the left axilla and neck, thinks it may be swollen lymph nodes. Patient informed will be scheduled to come to [facility] and then to [facility] for diagnostic mammogram. Patient verbalized understanding.

## 2022-01-11 ENCOUNTER — Emergency Department (HOSPITAL_COMMUNITY)
Admission: EM | Admit: 2022-01-11 | Discharge: 2022-01-11 | Disposition: A | Discharge status: Home / Self Care | Payer: Self-pay | Attending: Emergency Medicine | Admitting: Emergency Medicine

## 2022-01-11 ENCOUNTER — Encounter (HOSPITAL_COMMUNITY): Payer: Self-pay

## 2022-01-11 ENCOUNTER — Other Ambulatory Visit: Payer: Self-pay

## 2022-01-11 ENCOUNTER — Ambulatory Visit: Payer: Self-pay | Admitting: *Deleted

## 2022-01-11 ENCOUNTER — Emergency Department (HOSPITAL_COMMUNITY): Payer: Self-pay

## 2022-01-11 VITALS — BP 130/76 | Wt 170.9 lb

## 2022-01-11 DIAGNOSIS — Z1211 Encounter for screening for malignant neoplasm of colon: Secondary | ICD-10-CM

## 2022-01-11 DIAGNOSIS — N644 Mastodynia: Secondary | ICD-10-CM

## 2022-01-11 DIAGNOSIS — Z1239 Encounter for other screening for malignant neoplasm of breast: Secondary | ICD-10-CM

## 2022-01-11 DIAGNOSIS — M545 Low back pain, unspecified: Secondary | ICD-10-CM

## 2022-01-11 DIAGNOSIS — M5136 Other intervertebral disc degeneration, lumbar region: Secondary | ICD-10-CM | POA: Insufficient documentation

## 2022-01-11 DIAGNOSIS — N6325 Unspecified lump in the left breast, overlapping quadrants: Secondary | ICD-10-CM

## 2022-01-11 MED ORDER — TRAMADOL HCL 50 MG PO TABS
50.0000 mg | ORAL_TABLET | Freq: Once | ORAL | Status: AC
  Administered 2022-01-11: 09:00:00 50 mg via ORAL
  Filled 2022-01-11: qty 1

## 2022-01-11 MED ORDER — TRAMADOL HCL 50 MG PO TABS: 50.0000 mg | ORAL_TABLET | Freq: Four times a day (QID) | ORAL | 0 refills | Status: AC | PRN

## 2022-01-11 MED ORDER — PREDNISONE 20 MG PO TABS: 40.0000 mg | ORAL_TABLET | Freq: Every day | ORAL | 0 refills | Status: AC

## 2022-01-11 MED ORDER — LIDOCAINE 4 % EX PTCH: 1.0000 | MEDICATED_PATCH | Freq: Two times a day (BID) | CUTANEOUS | 0 refills | Status: DC | PRN

## 2022-01-11 MED ORDER — LIDOCAINE 5 % EX PTCH
1.0000 | MEDICATED_PATCH | CUTANEOUS | Status: DC
  Administered 2022-01-11: 09:00:00 1 via TRANSDERMAL
  Filled 2022-01-11: qty 1

## 2022-01-11 NOTE — ED Triage Notes (Signed)
Lower right sided back pain that radiates to groin and knee. Nontraumatic pain since Saturday. Attempted a massage and acupuncture with no improvement.

## 2022-01-11 NOTE — ED Provider Notes (Signed)
[place] DEPT Provider Note   CSN: [id number] Arrival date & time: 01/11/22  0550     History  No chief complaint on file.   Rebecca Mays is a 46 y.o. female with no pertinent past medical history.  Presents to the emergency department with a chief complaint of right lumbar back pain.  Patient states that she first experienced pain in this area 3 weeks prior after gardening.  Patient reports that she had 2 massages and acupuncture treatment with resolution of her pain.  Patient states that pain came back Saturday 1/21.  Pain has been constant since then.  Patient describes pain as "shooting."  Patient rates pain 7/10 on the pain scale.  Pain radiates to right leg.  Patient reports that she has had intermittent decree sensation to right leg since Saturday.  Patient denies any fever, chills, unexpected weight loss, numbness, weakness, saddle anesthesia, bowel or bladder dysfunction, dysuria, hematuria, urinary urgency, flank pain, vaginal pain, vaginal bleeding, vaginal discharge, IV drug use, history of malignancy.  Patient is adamant that she is not pregnant.  Patient endorses IUD.  HPI     Home Medications Prior to Admission medications   Medication Sig Start Date End Date Taking? Authorizing Provider  cyclobenzaprine (FLEXERIL) 10 MG tablet Take 1 tablet (10 mg total) by mouth 3 (three) times daily as needed for muscle spasms. Patient not taking: Reported on 04/05/2018 03/08/18   Tenna Delaine D, PA-C  naproxen (NAPROSYN) 500 MG tablet Take 1 tablet (500 mg total) by mouth 2 (two) times daily with a meal. Patient not taking: Reported on 04/05/2018 03/08/18   Tenna Delaine D, PA-C      Allergies    Codeine    Review of Systems   Review of Systems  Constitutional:  Negative for chills and fever.  Respiratory:  Negative for shortness of breath.   Cardiovascular:  Negative for chest pain.  Gastrointestinal:  Negative for abdominal pain,  nausea and vomiting.  Genitourinary:  Negative for decreased urine volume, difficulty urinating, dysuria, flank pain, frequency, genital sores, hematuria, pelvic pain, urgency, vaginal bleeding, vaginal discharge and vaginal pain.  Musculoskeletal:  Positive for back pain. Negative for neck pain.  Skin:  Negative for color change and rash.  Allergic/Immunologic: Negative for immunocompromised state.  Neurological:  Negative for dizziness, syncope, weakness, light-headedness, numbness and headaches.  Psychiatric/Behavioral:  Negative for confusion.    Physical Exam Updated Vital Signs BP (!) 131/95 (BP Location: Right Arm)    Pulse 72    Temp 97.6 F (36.4 C)    Resp 19    Ht 5\' 4"  (1.626 m)    Wt 77.1 kg    SpO2 100%    BMI 29.18 kg/m  Physical Exam Vitals and nursing note reviewed.  Constitutional:      General: She is not in acute distress.    Appearance: She is not ill-appearing, toxic-appearing or diaphoretic.  HENT:     Head: Normocephalic.  Eyes:     General: No scleral icterus.       Right eye: No discharge.        Left eye: No discharge.  Cardiovascular:     Rate and Rhythm: Normal rate.     Pulses:          Dorsalis pedis pulses are 2+ on the right side.  Pulmonary:     Effort: Pulmonary effort is normal.  Abdominal:     General: Abdomen is flat. There is no  distension. There are no signs of injury.     Palpations: Abdomen is soft. There is no mass or pulsatile mass.     Tenderness: There is no abdominal tenderness. There is no right CVA tenderness, left CVA tenderness, guarding or rebound.     Hernia: There is no hernia in the umbilical area or ventral area.  Musculoskeletal:     Cervical back: No swelling, edema, deformity, erythema, signs of trauma, lacerations, rigidity, spasms, torticollis, tenderness, bony tenderness or crepitus. No pain with movement. Normal range of motion.     Thoracic back: No swelling, edema, deformity, signs of trauma, lacerations, spasms,  tenderness or bony tenderness. Normal range of motion.     Lumbar back: Tenderness present. No swelling, edema, deformity, signs of trauma, lacerations, spasms or bony tenderness. Normal range of motion.     Right hip: No deformity, tenderness, bony tenderness or crepitus.     Left hip: No deformity, tenderness, bony tenderness or crepitus.     Right upper leg: No deformity, tenderness or bony tenderness.     Left upper leg: No deformity, tenderness or bony tenderness.     Right knee: No swelling, deformity, effusion, erythema, ecchymosis, lacerations, bony tenderness or crepitus. Normal range of motion. No tenderness. Normal alignment.     Left knee: No swelling, deformity, effusion, erythema, ecchymosis, lacerations, bony tenderness or crepitus. Normal range of motion. No tenderness. Normal alignment.     Right lower leg: Normal.     Left lower leg: Normal.     Right ankle: No swelling, deformity, ecchymosis or lacerations. No tenderness. Normal range of motion.     Left ankle: No swelling, deformity, ecchymosis or lacerations. No tenderness. Normal range of motion.     Right foot: Normal range of motion and normal capillary refill. No swelling, deformity, laceration, tenderness, bony tenderness or crepitus. Normal pulse.     Comments: No midline tenderness or deformity to cervical, thoracic, or lumbar spine.  Tenderness to right lumbar back.  No tenderness, bony tenderness, or deformity to bilateral lower extremities.  Patient has full range of motion to bilateral knees.  Skin:    General: Skin is warm and dry.  Neurological:     General: No focal deficit present.     Mental Status: She is alert.     GCS: GCS eye subscore is 4. GCS verbal subscore is 5. GCS motor subscore is 6.     Comments: Sensation to light touch grossly intact to bilateral upper and lower extremities.  +5 strength to bilateral lower extremities   Psychiatric:        Behavior: Behavior is cooperative.    ED Results /  Procedures / Treatments   Labs (all labs ordered are listed, but only abnormal results are displayed) Labs Reviewed - No data to display  EKG None  Radiology DG Lumbar Spine Complete  Result Date: 01/11/2022 CLINICAL DATA:  Lower back pain after injury. EXAM: LUMBAR SPINE - COMPLETE 4+ VIEW COMPARISON:  None. FINDINGS: There is no evidence of lumbar spine fracture. Alignment is normal. Minimal degenerative disc disease is noted at L3-4. 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 No acute abnormality seen. Electronically Signed   By: Marijo Conception M.D.   On: 01/11/2022 09:12   DG Hip Unilat W or Wo Pelvis 2-3 Views Right  Result Date: 01/11/2022 CLINICAL DATA:  Right hip pain after injury. EXAM: DG HIP (WITH OR WITHOUT PELVIS) 2-3V RIGHT COMPARISON:  None. FINDINGS: There  is no evidence of hip fracture or dislocation. There is no evidence of arthropathy or other focal bone abnormality. IMPRESSION: Negative. Electronically Signed   By: Marijo Conception M.D.   On: 01/11/2022 09:14    Procedures Procedures    Medications Ordered in ED Medications  traMADol (ULTRAM) tablet 50 mg (50 mg Oral Given 01/11/22 4315)    ED Course/ Medical Decision Making/ A&P                           Medical Decision Making Amount and/or Complexity of Data Reviewed Radiology: ordered.  Risk OTC drugs. Prescription drug management.   This patient presents to the ED for concern of right lumbar back pain, this involves an extensive number of treatment options, and is a complaint that carries with it a high risk of complications and morbidity.  The differential diagnosis includes but is not limited to musculoskeletal injury, sciatica, ureteral/nephrolithiasis, cauda equina syndrome   Co morbidities that complicate the patient evaluation  N/a   Additional history obtained:  External records from outside source obtained and reviewed   Imaging Studies ordered:  I ordered  imaging studies including x-ray imaging right hip, x-ray imaging lumbar spine I independently visualized and interpreted imaging which showed no acute osseous abnormality.  Minimal degenerative disc disease noted at L3-L4. I agree with the radiologist interpretation   Medicines ordered and prescription drug management:  I ordered medication including tramadol  for pain management  Reevaluation of the patient after these medicines showed that the patient improved I have reviewed the patients home medicines and have made adjustments as needed   Problem List / ED Course:  Right lumbar back pain with radiation to right leg Patient denies any urinary symptoms.  No CVA tenderness.  Abdomen soft, nondistended, nontender with no guarding or rebound tenderness.  Low suspicion for renal/ureteral calculus at this time. Patient denies any IV drug use, afebrile.  Low suspicion for epidural abscess Sensation and strength intact to bilateral lower extremities; low suspicion for cauda equina syndrome. X-ray imaging shows no acute osseous abnormality. Suspect that patient's pain is musculoskeletal in nature with possible sciatica.  Patient improving her pain after receiving tramadol.  Will prescribe patient with short course of tramadol, prednisone, and lidocaine patches.  Patient advised to follow-up with orthopedic provider in outpatient setting.   Reevaluation:  After the interventions noted above, I reevaluated the patient and found that they have :improved   Disposition:  After consideration of the diagnostic results and the patients response to treatment, I feel that the patent would benefit from discharge and follow-up with orthopedic provider.          Final Clinical Impression(s) / ED Diagnoses Final diagnoses:  Lumbar back pain    Rx / DC Orders ED Discharge Orders          Ordered    traMADol (ULTRAM) 50 MG tablet  Every 6 hours PRN        01/11/22 0928    Lidocaine (HM  LIDOCAINE PATCH) 4 % PTCH  Every 12 hours PRN        01/11/22 0928    predniSONE (DELTASONE) 20 MG tablet  Daily        01/11/22 0928              Loni Beckwith, PA-C 01/11/22 Colon Flattery, MD 01/12/22 [phone]

## 2022-01-11 NOTE — Patient Instructions (Signed)
Explained breast self awareness with Rebecca Mays. Patient did not need a Pap smear today due to last Pap smear and HPV typing was 12/04/2019. Let patient know based on her history and last Pap smear result being ASCUS with negative HPV that her next Pap smear is due in 3 years. Referred patient to the [facility] for a diagnostic mammogram. Appointment scheduled Thursday, January 27, 2022 at 1300. Patient aware of appointment and will be there. Discussed smoking cessation with patient. Referred to the [facility] Quitline. Rebecca Mays verbalized understanding.  Tessah Patchen, Arvil Chaco, RN 1:16 PM

## 2022-01-11 NOTE — Discharge Instructions (Addendum)
You came to the emergency department today to be evaluated for your back pain.  Your physical exam is reassuring.  Your x-ray showed no broken bones or dislocations.  Your pain is likely musculoskeletal in nature and may or may not include sciatica.  For your pain you were given tramadol and a prescription for prednisone.  Please follow-up with [facility] if your symptoms do not improve.  Some common side effects of steroids include feelings of extra energy, feeling warm, increased appetite, and stomach upset.  If you are diabetic your sugars may run higher than usual.   Today you received medications that may make you sleepy or impair your ability to make decisions.  For the next 24 hours please do not drive, operate heavy machinery, care for a small child with out another adult present, or perform any activities that may cause harm to you or someone else if you were to fall asleep or be impaired.   You are being prescribed a medication which may make you sleepy. Please follow up of listed precautions for at least 24 hours after taking one dose.  Get help right away if: You develop new bowel or bladder control problems. You have unusual weakness or numbness in your arms or legs. You feel faint.
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
## 2022-01-22 ENCOUNTER — Ambulatory Visit
Admission: RE | Admit: 2022-01-22 | Discharge: 2022-01-22 | Disposition: A | Discharge status: Home / Self Care | Payer: No Typology Code available for payment source | Source: Ambulatory Visit | Attending: Obstetrics and Gynecology | Admitting: Obstetrics and Gynecology

## 2022-01-22 DIAGNOSIS — N6320 Unspecified lump in the left breast, unspecified quadrant: Secondary | ICD-10-CM

## 2022-01-26 ENCOUNTER — Ambulatory Visit: Payer: Self-pay | Admitting: Family Medicine

## 2022-01-27 ENCOUNTER — Other Ambulatory Visit: Payer: No Typology Code available for payment source

## 2022-04-20 LAB — FECAL OCCULT BLOOD, IMMUNOCHEMICAL: Fecal Occult Bld: POSITIVE — AB

## 2022-04-21 ENCOUNTER — Telehealth: Payer: Self-pay

## 2022-04-21 NOTE — Telephone Encounter (Signed)
Attempted to contact patient regarding lab results. Left message on voicemail requesting a return call.  

## 2022-04-25 ENCOUNTER — Telehealth: Payer: Self-pay

## 2022-04-25 NOTE — Telephone Encounter (Addendum)
Patient to discuss with pcp on 05/03/2022 @ [facility].  ? ?Patient informed positive FIT test results. Patient verbalized understanding, but stated she has no pcp. Patient informed will call back with referral to a PCP or GI.   ?

## 2022-05-03 ENCOUNTER — Ambulatory Visit: Payer: Self-pay | Attending: Family Medicine | Admitting: Family Medicine

## 2022-05-03 ENCOUNTER — Encounter: Payer: Self-pay | Admitting: Family Medicine

## 2022-05-03 ENCOUNTER — Encounter: Payer: Self-pay | Admitting: Gastroenterology

## 2022-05-03 ENCOUNTER — Other Ambulatory Visit: Payer: Self-pay

## 2022-05-03 VITALS — BP 127/79 | HR 68 | Ht 64.0 in | Wt 165.6 lb

## 2022-05-03 DIAGNOSIS — F320 Major depressive disorder, single episode, mild: Secondary | ICD-10-CM

## 2022-05-03 DIAGNOSIS — F329 Major depressive disorder, single episode, unspecified: Secondary | ICD-10-CM | POA: Insufficient documentation

## 2022-05-03 DIAGNOSIS — Z1159 Encounter for screening for other viral diseases: Secondary | ICD-10-CM

## 2022-05-03 DIAGNOSIS — Z780 Asymptomatic menopausal state: Secondary | ICD-10-CM

## 2022-05-03 DIAGNOSIS — Z13228 Encounter for screening for other metabolic disorders: Secondary | ICD-10-CM

## 2022-05-03 DIAGNOSIS — R195 Other fecal abnormalities: Secondary | ICD-10-CM

## 2022-05-03 HISTORY — DX: Major depressive disorder, single episode, unspecified: F32.9

## 2022-05-03 MED ORDER — VENLAFAXINE HCL ER 75 MG PO CP24
75.0000 mg | ORAL_CAPSULE | Freq: Every day | ORAL | 3 refills | Status: AC
  Filled 2022-05-03 – 2022-06-15 (×2): qty 30, 30d supply, fill #0

## 2022-05-03 NOTE — Progress Notes (Signed)
Blood in stool  ?Discuss menopause. ?

## 2022-05-03 NOTE — Patient Instructions (Signed)
[facility] [address], [place], [place] [address] [phone] or [phone] Walk-in urgent care 24/7 for anyone  For [place] Residents ONLY New patient assessments and therapy walk-ins: Monday and Wednesday 8am-11am First and second Friday 1pm-5pm New patient psychiatry and medication management walk-ins: Mondays, Wednesdays, Thursdays, Fridays 8am-11am No psychiatry walk-ins on Tuesday   *Accepts all insurance and uninsured for Urgent Care needs *Accepts Medicaid and uninsured for outpatient treatment   [facility] (Therapy and psychiatry) [facility] (near [employer] [facility]) [address], [address] [place], [place] [address] [phone] Fax: [phone] (INSURANCE REQUIRED-MEDICAID ACCEPTED)   [facility] [address] [place],  [place]  [address] [phone] Call for appointment  [facility] (Therapy only)  [facility] [address], [place], [place] [address] Monday - Friday: 8:30 a.m.-12 p.m. / 1 p.m.-2:30 p.m.  [facility] [address], [place], [place] [address] Monday-Friday: 8:30 a.m.-12 p.m. / 2-3:30 p.m. (INSURANCE REQUIRED -MEDICAID ACCEPTED) They do offer a sliding fee scale $20-$30/session   [facility] [address] [place], [place] [address] Phone: [phone]  [facility] [address] [place] [address]  Phone: [phone] (Does not accept Medicaid) (only one provider accepts Medicare) [facility] [address] ([address]) [address] [place], [place] [address] (Accepts Medicaid and Medicare)  [facility]  [address]  [place], [place] [address]  Phone: [phone]  [address] [place], [place] [address] Phone: [phone] (Accepts Medicaid) [facility] (Therapy only)  [address], [place], [place] [address] Phone: [phone]   [facility] (Therapy only)  [address] [place], [place] [address] Phone: [phone] (Accepts Medicaid & Medicare)   [facility] [address], [address] [place], [place] [address] Phone: [phone] (Accepts [place] Health Choice) [facility] [address] [place], [place] [address] Phone: [phone] (Accepts Medicaid) [facility] (Psychiatry only)  [phone] [address], [place], [place] [address] (Accepts Medicaid and Medicare) [facility] (Psychiatry and therapy)  [address], [place], [place] [address] [phone] (Accepts Medicare) [facility] (psychiatry and therapy) [address], [place], [place] [address] [phone]  [facility] [address], [place], [place] [address] [phone] Accept Aetna, BCBS, Cigna, Medcost, Tricare,  and the following types of Medicaid; Alliance, Cardinal, Partners, Vaya, [place] Health Choice, [place] Access Plans, Healthy Blue, [place] Complete, and WellCare, as well as offering a sliding scale and private payment options. Provides In-Office Appointments, Virtual Appointments, and Phone Consultations Offers medication management for ages 4 years old and up, including,  Medication Management for Suboxone and Vivitrol  [facility] [phone] [address], [place], [place] [address] (Accepts Medicaid and Medicare)         19.  [facility] (therapy only)  [address] [place],  [address]            [phone] (Accepts medicare) 20. [facility]  (Suboxone and methodone) [phone] [address], ,  [address] To Be Eligible for Opioid Treatment at [facility] you must be at least 48 years of age you have already tried other  interventions that were not successful such as opioid   detox, inpatient rehab for opioids, or outpatient counseling specifically for opioid dependency your [facility] drug test must be completely free of benzodiazepines (klonopin, xanax, valium, ativan, or other benz) you have reliable transportation to the [facility] clinic in [place] you recognize that counseling is a critical component of [facility]' Opioid Program and you agree to attend all required counseling sessions you are committed to total drug abstinence and will conscientiously strive to remain free of alcohol, marijuana, and other illicit substances while in treatment you desire a peaceful treatment atmosphere in which personal responsibility and respect toward staff and clients is the norm   21. [facility] [address], [place], [place] [address] Offers SAIOP (Substance Abuse Intensive Outpatient Program) [phone] 22. [facility] [address] [place], [place] [address] [phone] (Accepts medicare)  For those who are tech savvy, go on psychology today, type in your local city (i.e. [place]. [place]) and specify your insurance at the top of the screen after you search. (Medicaid if needed). You can also specify whether you are interested in therapy and psychiatry.  [email or web address].com/us      
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
## 2022-05-04 ENCOUNTER — Other Ambulatory Visit: Payer: Self-pay

## 2022-05-04 ENCOUNTER — Other Ambulatory Visit: Payer: Self-pay | Admitting: Family Medicine

## 2022-05-04 DIAGNOSIS — E7800 Pure hypercholesterolemia, unspecified: Secondary | ICD-10-CM

## 2022-05-04 MED ORDER — ATORVASTATIN CALCIUM 20 MG PO TABS
20.0000 mg | ORAL_TABLET | Freq: Every day | ORAL | 3 refills | Status: AC
  Filled 2022-05-04: qty 30, 30d supply, fill #0
  Filled 2022-06-15: qty 30, 30d supply, fill #1

## 2022-05-05 LAB — CBC WITH DIFFERENTIAL/PLATELET
Basophils Absolute: 0 10*3/uL (ref 0.0–0.2)
Basos: 1 %
EOS (ABSOLUTE): 0.2 10*3/uL (ref 0.0–0.4)
Eos: 2 %
Hematocrit: 41 % (ref 34.0–46.6)
Hemoglobin: 13.9 g/dL (ref 11.1–15.9)
Immature Grans (Abs): 0 10*3/uL (ref 0.0–0.1)
Immature Granulocytes: 1 %
Lymphocytes Absolute: 2.6 10*3/uL (ref 0.7–3.1)
Lymphs: 32 %
MCH: 31.7 pg (ref 26.6–33.0)
MCHC: 33.9 g/dL (ref 31.5–35.7)
MCV: 93 fL (ref 79–97)
Monocytes Absolute: 0.5 10*3/uL (ref 0.1–0.9)
Monocytes: 7 %
Neutrophils Absolute: 4.7 10*3/uL (ref 1.4–7.0)
Neutrophils: 57 %
Platelets: 251 10*3/uL (ref 150–450)
RBC: 4.39 x10E6/uL (ref 3.77–5.28)
RDW: 12.4 % (ref 11.7–15.4)
WBC: 8 10*3/uL (ref 3.4–10.8)

## 2022-05-05 LAB — CMP14+EGFR
ALT: 19 IU/L (ref 0–32)
AST: 16 IU/L (ref 0–40)
Albumin/Globulin Ratio: 1.9 (ref 1.2–2.2)
Albumin: 4.5 g/dL (ref 3.8–4.8)
Alkaline Phosphatase: 82 IU/L (ref 44–121)
BUN/Creatinine Ratio: 21 (ref 9–23)
BUN: 14 mg/dL (ref 6–24)
Bilirubin Total: <0.2 mg/dL (ref 0.0–1.2)
CO2: 20 mmol/L (ref 20–29)
Calcium: 9.4 mg/dL (ref 8.7–10.2)
Chloride: 105 mmol/L (ref 96–106)
Creatinine, Ser: 0.68 mg/dL (ref 0.57–1.00)
Globulin, Total: 2.4 g/dL (ref 1.5–4.5)
Glucose: 96 mg/dL (ref 70–99)
Potassium: 4.8 mmol/L (ref 3.5–5.2)
Sodium: 139 mmol/L (ref 134–144)
Total Protein: 6.9 g/dL (ref 6.0–8.5)
eGFR: 107 mL/min/{1.73_m2} (ref >59)

## 2022-05-05 LAB — LP+NON-HDL CHOLESTEROL
Cholesterol, Total: 261 mg/dL — ABNORMAL HIGH (ref 100–199)
HDL: 46 mg/dL (ref >39)
LDL Chol Calc (NIH): 157 mg/dL — ABNORMAL HIGH (ref 0–99)
Total Non-HDL-Chol (LDL+VLDL): 215 mg/dL — ABNORMAL HIGH (ref 0–129)
Triglycerides: 308 mg/dL — ABNORMAL HIGH (ref 0–149)
VLDL Cholesterol Cal: 58 mg/dL — ABNORMAL HIGH (ref 5–40)

## 2022-05-05 LAB — HEMOGLOBIN A1C
Est. average glucose Bld gHb Est-mCnc: 120 mg/dL
Hgb A1c MFr Bld: 5.8 % — ABNORMAL HIGH (ref 4.8–5.6)

## 2022-05-05 LAB — HCV INTERPRETATION

## 2022-05-05 LAB — HIV ANTIBODY (ROUTINE TESTING W REFLEX): HIV Screen 4th Generation wRfx: NONREACTIVE

## 2022-05-05 LAB — HCV AB W REFLEX TO QUANT PCR: HCV Ab: NONREACTIVE

## 2022-05-11 ENCOUNTER — Other Ambulatory Visit: Payer: Self-pay

## 2022-05-11 ENCOUNTER — Encounter: Payer: Self-pay | Admitting: Gastroenterology

## 2022-05-17 ENCOUNTER — Other Ambulatory Visit: Payer: Self-pay

## 2022-05-24 ENCOUNTER — Ambulatory Visit (AMBULATORY_SURGERY_CENTER): Payer: Self-pay | Admitting: *Deleted

## 2022-05-24 ENCOUNTER — Other Ambulatory Visit: Payer: Self-pay

## 2022-05-24 VITALS — Ht 64.0 in | Wt 162.0 lb

## 2022-05-24 DIAGNOSIS — Z1211 Encounter for screening for malignant neoplasm of colon: Secondary | ICD-10-CM

## 2022-05-24 NOTE — Progress Notes (Signed)
Pre visit completed in person.  No egg or soy allergy known to patient  No issues known to pt with past sedation with any surgeries or procedures Patient denies ever being told they had issues or difficulty with intubation  No FH of Malignant Hyperthermia Pt is not on diet pills Pt is not on  home 02  Pt is not on blood thinners  Pt denies issues with constipation  No A fib or A flutter   PV completed over the phone. Pt verified name, DOB, address and insurance during PV today.  Pt mailed instruction packet with copy of consent form to read and not return, and instructions.  Pt encouraged to call with questions or issues.  If pt has My chart, procedure instructions sent via My Chart  Insurance confirmed with pt at [facility] today

## 2022-05-27 ENCOUNTER — Ambulatory Visit: Payer: No Typology Code available for payment source | Admitting: Gastroenterology

## 2022-06-10 ENCOUNTER — Ambulatory Visit (AMBULATORY_SURGERY_CENTER): Payer: Self-pay | Admitting: Gastroenterology

## 2022-06-10 ENCOUNTER — Encounter: Payer: Self-pay | Admitting: Gastroenterology

## 2022-06-10 VITALS — BP 105/70 | HR 55 | Temp 98.2°F | Resp 12 | Ht 64.0 in | Wt 162.0 lb

## 2022-06-10 DIAGNOSIS — D125 Benign neoplasm of sigmoid colon: Secondary | ICD-10-CM

## 2022-06-10 DIAGNOSIS — K635 Polyp of colon: Secondary | ICD-10-CM

## 2022-06-10 DIAGNOSIS — D122 Benign neoplasm of ascending colon: Secondary | ICD-10-CM

## 2022-06-10 DIAGNOSIS — Z1211 Encounter for screening for malignant neoplasm of colon: Secondary | ICD-10-CM

## 2022-06-10 MED ORDER — SODIUM CHLORIDE 0.9 % IV SOLN: 500.0000 mL | Freq: Once | INTRAVENOUS | Status: AC

## 2022-06-10 NOTE — Progress Notes (Signed)
To pacu, VSS. Report to [email or web address].tb 

## 2022-06-10 NOTE — Progress Notes (Signed)
Called to room to assist during endoscopic procedure.  Patient ID and intended procedure confirmed with present staff. Received instructions for my participation in the procedure from the performing physician.  

## 2022-06-13 ENCOUNTER — Telehealth: Payer: Self-pay | Admitting: *Deleted

## 2022-06-15 ENCOUNTER — Other Ambulatory Visit: Payer: Self-pay

## 2022-06-15 ENCOUNTER — Encounter: Payer: Self-pay | Admitting: Family Medicine

## 2022-06-15 ENCOUNTER — Ambulatory Visit: Payer: No Typology Code available for payment source | Attending: Family Medicine | Admitting: Family Medicine

## 2022-06-15 VITALS — BP 113/75 | HR 63 | Temp 98.5°F | Ht 64.0 in | Wt 166.6 lb

## 2022-06-15 DIAGNOSIS — E7800 Pure hypercholesterolemia, unspecified: Secondary | ICD-10-CM

## 2022-06-15 DIAGNOSIS — F320 Major depressive disorder, single episode, mild: Secondary | ICD-10-CM

## 2022-06-15 NOTE — Progress Notes (Signed)
Subjective:  Patient ID: Rebecca Mays, female    DOB: 1976-03-06  Age: 46 y.o. MRN: [id number]  CC: Depression   HPI Rebecca Mays is a 46 y.o. year old female with a history of depression, hyperlipidemia here for follow-up visit.  Interval History: At her last office visit Effexor was initiated.  Atorvastatin was also commenced after her lipid panel returned elevated. She picked up the Atorvastatin but not the Effexor as it was not ready the pharmacy told her so she is yet to take the Effexor.  Overall she states she is in a good place and her anxiety and depression are stable.  I had also provided her with counseling resources but she is yet to reach out to schedule an appointment.  She had a colonoscopy due to positive hemoccult and this revealed polyps and she was placed on a 3 year recall. Pathology revealed: Diagnosis 1. Surgical [P], colon, ascending polyp x1, polyp (1) - SESSILE SERRATED POLYP WITHOUT DYSPLASIA. 2. Surgical [P], colon, ascending polyp x 1, polyp (1) - SESSILE SERRATED POLYP WITH DYSPLASIA. 3. Surgical [P], colon, sigmoid polyp x 1, polyp (1) - TUBULOVILLOUS ADENOMA WITH PSEUDO INVASION. - NEGATIVE FOR MALIGNANCY/INVASION.  She has no additional concerns today. Past Medical History:  Diagnosis Date   Major depressive disorder 05/03/2022    Past Surgical History:  Procedure Laterality Date   CESAREAN SECTION      Family History  Problem Relation Age of Onset   Pancreatic cancer Mother    Cancer Mother    Cancer Father    Heart disease Maternal Grandmother    Cancer Maternal Grandfather    Mental illness Paternal Grandmother    Colon cancer Neg Hx    Colon polyps Neg Hx    Stomach cancer Neg Hx    Rectal cancer Neg Hx    Esophageal cancer Neg Hx     Social History   Socioeconomic History   Marital status: Single    Spouse name: Not on file   Number of children: 2   Years of education: Not on file   Highest education level: Bachelor's  degree (e.g., BA, AB, BS)  Occupational History   Not on file  Tobacco Use   Smoking status: Light Smoker   Smokeless tobacco: Never  Vaping Use   Vaping Use: Never used  Substance and Sexual Activity   Alcohol use: Yes    Alcohol/week: 4.0 standard drinks of alcohol    Types: 3 Glasses of wine, 1 Cans of beer per week    Comment: socially   Drug use: Yes    Types: Marijuana   Sexual activity: Yes    Birth control/protection: I.U.D.  Other Topics Concern   Not on file  Social History Narrative   Not on file   Social Determinants of Health   Financial Resource Strain: Not on file  Food Insecurity: No Food Insecurity (01/11/2022)   Hunger Vital Sign    Worried About Running Out of Food in the Last Year: Never true    Ran Out of Food in the Last Year: Never true  Transportation Needs: No Transportation Needs (01/11/2022)   PRAPARE - Hydrologist (Medical): No    Lack of Transportation (Non-Medical): No  Physical Activity: Not on file  Stress: Not on file  Social Connections: Not on file    Allergies  Allergen Reactions   Codeine Nausea And Vomiting    Outpatient Medications Prior to Visit  Medication Sig Dispense Refill   atorvastatin (LIPITOR) 20 MG tablet Take 1 tablet (20 mg total) by mouth daily. 30 tablet 3   ibuprofen (ADVIL) 600 MG tablet Take 600 mg by mouth every 6 (six) hours as needed.     venlafaxine XR (EFFEXOR XR) 75 MG 24 hr capsule Take 1 capsule (75 mg total) by mouth daily with breakfast. (Patient not taking: Reported on 06/10/2022) 30 capsule 3   Facility-Administered Medications Prior to Visit  Medication Dose Route Frequency Provider Last Rate Last Admin   0.9 %  sodium chloride infusion  500 mL Intravenous Once [place], MD         ROS Review of Systems  Constitutional:  Negative for activity change and appetite change.  HENT:  Negative for sinus pressure and sore throat.   Respiratory:  Negative for chest  tightness, shortness of breath and wheezing.   Cardiovascular:  Negative for chest pain and palpitations.  Gastrointestinal:  Negative for abdominal distention, abdominal pain and constipation.  Genitourinary: Negative.   Musculoskeletal: Negative.   Psychiatric/Behavioral:  Negative for behavioral problems and dysphoric mood.     Objective:  BP 113/75   Pulse 63   Temp 98.5 F (36.9 C) (Oral)   Ht '5\' 4"'$  (1.626 m)   Wt 166 lb 9.6 oz (75.6 kg)   LMP 05/30/2022 (Approximate)   SpO2 98%   BMI 28.60 kg/m      06/15/2022   10:54 AM 06/10/2022    2:20 PM 06/10/2022    2:10 PM  BP/Weight  Systolic BP 628 366 294  Diastolic BP 75 70 74  Wt. (Lbs) 166.6    BMI 28.6 kg/m2        Physical Exam Constitutional:      Appearance: She is well-developed.  Cardiovascular:     Rate and Rhythm: Normal rate.     Heart sounds: Normal heart sounds. No murmur heard. Pulmonary:     Effort: Pulmonary effort is normal.     Breath sounds: Normal breath sounds. No wheezing or rales.  Chest:     Chest wall: No tenderness.  Abdominal:     General: Bowel sounds are normal. There is no distension.     Palpations: Abdomen is soft. There is no mass.     Tenderness: There is no abdominal tenderness.  Musculoskeletal:        General: Normal range of motion.     Right lower leg: No edema.     Left lower leg: No edema.  Neurological:     Mental Status: She is alert and oriented to person, place, and time.  Psychiatric:        Mood and Affect: Mood normal.        Latest Ref Rng & Units 05/03/2022    9:38 AM 10/14/2009   10:50 AM  CMP  Glucose 70 - 99 mg/dL 96  93   BUN 6 - 24 mg/dL 14  10   Creatinine 0.57 - 1.00 mg/dL 0.68  0.70   Sodium 134 - 144 mmol/L 139  136   Potassium 3.5 - 5.2 mmol/L 4.8  4.2   Chloride 96 - 106 mmol/L 105  106   CO2 20 - 29 mmol/L 20  24   Calcium 8.7 - 10.2 mg/dL 9.4  9.2   Total Protein 6.0 - 8.5 g/dL 6.9  6.9   Total Bilirubin 0.0 - 1.2 mg/dL <0.2  0.6    Alkaline Phos 44 - 121 IU/L 82  66   AST 0 - 40 IU/L 16  23   ALT 0 - 32 IU/L 19  21     Lipid Panel     Component Value Date/Time   CHOL 261 (H) 05/03/2022 0938   TRIG 308 (H) 05/03/2022 0938   HDL 46 05/03/2022 0938   LDLCALC 157 (H) 05/03/2022 0938    CBC    Component Value Date/Time   WBC 8.0 05/03/2022 0938   WBC 8.3 10/14/2009 1050   RBC 4.39 05/03/2022 0938   RBC 4.39 10/14/2009 1050   HGB 13.9 05/03/2022 0938   HCT 41.0 05/03/2022 0938   PLT 251 05/03/2022 0938   MCV 93 05/03/2022 0938   MCH 31.7 05/03/2022 0938   MCHC 33.9 05/03/2022 0938   MCHC 34.5 10/14/2009 1050   RDW 12.4 05/03/2022 0938   LYMPHSABS 2.6 05/03/2022 0938   MONOABS 0.7 10/14/2009 1050   EOSABS 0.2 05/03/2022 0938   BASOSABS 0.0 05/03/2022 8850    Lab Results  Component Value Date   HGBA1C 5.8 (H) 05/03/2022    Assessment & Plan:  1. Pure hypercholesterolemia Uncontrolled Continue atorvastatin and low-cholesterol diet We will recheck lipid panel at next visit  2. Current mild episode of major depressive disorder without prior episode ([place]) Improved She is yet to begin Effexor We will reassess at next visit I have provided counseling resources to her again today    No orders of the defined types were placed in this encounter.   Follow-up: Return in about 3 months (around 09/15/2022) for Chronic medical conditions.       Charlott Rakes, MD, FAAFP. [facility] [place], [place]   06/15/2022, 12:06 PM

## 2022-06-15 NOTE — Patient Instructions (Signed)
[facility] [address], [place], [place] [address] [phone] or [phone] Walk-in urgent care 24/7 for anyone  For [facility] ONLY New patient assessments and therapy walk-ins: Monday and Wednesday 8am-11am First and second Friday 1pm-5pm New patient psychiatry and medication management walk-ins: Mondays, Wednesdays, Thursdays, Fridays 8am-11am No psychiatry walk-ins on Tuesday   *Accepts all insurance and uninsured for Urgent Care needs *Accepts Medicaid and uninsured for outpatient treatment   [facility] (Therapy and psychiatry) [facility] (near [place]) [address] [place], [place] [address] [phone] Fax: [phone] ([place])   [place] [facility] [place],  [place]  [address] [phone] Call for appointment  [facility] of the [place] (Therapy only)  [facility] [address], [place], [place] [address] Monday - Friday: 8:30 a.m.-12 p.m. / 1 p.m.-2:30 p.m.  [facility] [address] [place], [place] [address] Monday-Friday: 8:30 a.m.-12 p.m. / 2-3:30 p.m. (INSURANCE REQUIRED -MEDICAID ACCEPTED) They do offer a sliding fee scale $20-$30/session   [facility] [place], [place] [address] Phone: [place] [address] [place] [address]  Phone: [phone] (Does not accept Medicaid) (only one provider accepts Medicare) [facility] [address] (at [employer]) [place], [place] [phone] (Accepts Medicaid and Medicare)  [facility]  [address]  [place], [place] [address]  Phone: [phone]  [address] [place], [place] [address] Phone: [phone] [facility] Medicaid) [facility] (Therapy only)  [address] [place], [place] [address] Phone: [phone]   [facility] (Therapy only)  [place], [place] [address] Phone: [phone] [facility])   [place] [address] [place], [place] [address] Phone: [phone] ([place]) [employer] [phone] [address] [place], [place] [address] Phone: [phone] [facility]) [facility] (Psychiatry only)  [phone] [address], [place], [place] [address] (Accepts Medicaid and Medicare) [facility] (Psychiatry and therapy)  [place], [place], [place] [address] [phone] [facility] Medicare) [place] (psychiatry and therapy) [address], [place], [place] [address] [phone]  [facility] at [address], [place], [place], [place] [address] [phone] Accept [address] [place], [place], [place], [place],  and the following types of Medicaid; [place], [place], Partners, [place], [place], [employer], Healthy [place], [place] Complete, and [place], as well as offering a Manufacturing systems engineer and private payment options. Provides In-Office Appointments, Virtual Appointments, and Phone Consultations Offers medication management for ages 46 years old and up, including,  Medication Management for Suboxone and [place] [phone] [address], [place], [place] [address] ([place] Medicaid and Medicare)         19.  [facility] (therapy only)  [address] [place], [place] [address]            [phone] (Accepts medicare) 20. [facility]  (Suboxone and methodone) [phone] [address], [place], [place] [address] To Be Eligible for Opioid Treatment at [facility] you must be at least 48 years of age you have already tried other  interventions that were not successful such as opioid  detox, inpatient rehab for opioids, or outpatient counseling specifically for opioid dependency your [facility] drug test must be completely free of benzodiazepines (klonopin, xanax, valium, ativan, or other benz) you have reliable transportation to the [facility] clinic in [place] you recognize that counseling is a critical component of [facility]' Opioid Program and you agree to attend all required counseling sessions you are committed to total drug abstinence and will conscientiously strive to remain free of alcohol, marijuana, and other illicit substances while in treatment you desire a peaceful treatment atmosphere in which personal responsibility and respect toward staff and clients is the norm   21. [facility] [address], [place], [place] [address] Offers SAIOP (Substance Abuse Intensive Outpatient Program) [phone] 22. [facility] [address] [place], [place] [address] [phone] (Accepts medicare)  For those who are tech savvy, go on psychology today, type in your local city (i.e. [place]. [place]) and specify your insurance at the top of the screen after you search. (Medicaid if needed). You can also specify whether you are interested in therapy and psychiatry.  www.psychologytoday.com/us      

## 2022-09-10 ENCOUNTER — Encounter: Payer: Self-pay | Admitting: Gastroenterology

## 2022-09-10 NOTE — Progress Notes (Signed)
Letter created

## 2022-09-10 NOTE — Progress Notes (Signed)
I tried to call again. Will send a letter with the recommendation for surveillance in 3 years.

## 2022-09-21 ENCOUNTER — Ambulatory Visit: Payer: No Typology Code available for payment source | Admitting: Family Medicine

## 2022-12-05 ENCOUNTER — Ambulatory Visit: Payer: No Typology Code available for payment source | Admitting: Family Medicine

## 2023-04-19 ENCOUNTER — Ambulatory Visit: Payer: Medicaid Other | Admitting: Family Medicine

## 2024-11-12 ENCOUNTER — Telehealth: Payer: Self-pay | Admitting: Family Medicine

## 2024-11-12 NOTE — Telephone Encounter (Signed)
 Copied from CRM #[id number]. Topic: Clinical - Medication Question >> Nov 12, 2024  4:11 PM Dedra B wrote:  Reason for CRM: Pt will be traveling out of the country next Tuesday and would like an antibiotic prescribed. [facility]s [facility] to [facility] on [address] Pls notify pt if prescription can be sent.

## 2024-11-12 NOTE — Telephone Encounter (Addendum)
 Contacted patient. Unable to reach patient or leave voicemail. Patient last saw Dr. Newlin in 06/15/2022

## 2024-11-13 NOTE — Telephone Encounter (Signed)
 LVM informing patient that she needs to schedule a video visit with PCP to discuss the need for the antibiotic.
